# Patient Record
Sex: Male | Born: 1945 | Race: Black or African American | Hispanic: No | Smoking: Current every day smoker
Health system: Southern US, Community
[De-identification: ages and names within clinical notes are randomized; demographics above are authoritative.]

## PROBLEM LIST (undated history)

## (undated) DIAGNOSIS — I10 Essential (primary) hypertension: Secondary | ICD-10-CM

## (undated) DIAGNOSIS — J449 Chronic obstructive pulmonary disease, unspecified: Secondary | ICD-10-CM

## (undated) DIAGNOSIS — E785 Hyperlipidemia, unspecified: Secondary | ICD-10-CM

## (undated) DIAGNOSIS — E119 Type 2 diabetes mellitus without complications: Secondary | ICD-10-CM

## (undated) DIAGNOSIS — J45909 Unspecified asthma, uncomplicated: Secondary | ICD-10-CM

## (undated) DIAGNOSIS — I639 Cerebral infarction, unspecified: Secondary | ICD-10-CM

## (undated) DIAGNOSIS — N400 Enlarged prostate without lower urinary tract symptoms: Secondary | ICD-10-CM

## (undated) DIAGNOSIS — G629 Polyneuropathy, unspecified: Secondary | ICD-10-CM

## (undated) HISTORY — DX: Hyperlipidemia, unspecified: E78.5

## (undated) HISTORY — DX: Type 2 diabetes mellitus without complications: E11.9

## (undated) HISTORY — DX: Chronic obstructive pulmonary disease, unspecified: J44.9

## (undated) HISTORY — DX: Benign prostatic hyperplasia without lower urinary tract symptoms: N40.0

## (undated) HISTORY — DX: Essential (primary) hypertension: I10

## (undated) HISTORY — DX: Polyneuropathy, unspecified: G62.9

## (undated) HISTORY — DX: Cerebral infarction, unspecified: I63.9

## (undated) HISTORY — DX: Unspecified asthma, uncomplicated: J45.909

---

## 1991-11-27 HISTORY — PX: BUNIONECTOMY: SHX129

## 2015-12-02 LAB — HEMOGLOBIN A1C: HEMOGLOBIN A1C: 10

## 2016-04-09 LAB — HEMOGLOBIN A1C: HEMOGLOBIN A1C: 9.9

## 2016-05-28 ENCOUNTER — Encounter: Payer: Self-pay | Admitting: "Endocrinology

## 2016-05-28 ENCOUNTER — Ambulatory Visit (INDEPENDENT_AMBULATORY_CARE_PROVIDER_SITE_OTHER): Payer: Medicare HMO | Admitting: "Endocrinology

## 2016-05-28 VITALS — BP 168/101 | HR 90 | Ht 67.5 in | Wt 320.0 lb

## 2016-05-28 DIAGNOSIS — Z794 Long term (current) use of insulin: Secondary | ICD-10-CM

## 2016-05-28 DIAGNOSIS — E118 Type 2 diabetes mellitus with unspecified complications: Secondary | ICD-10-CM

## 2016-05-28 DIAGNOSIS — I10 Essential (primary) hypertension: Secondary | ICD-10-CM

## 2016-05-28 DIAGNOSIS — IMO0002 Reserved for concepts with insufficient information to code with codable children: Secondary | ICD-10-CM | POA: Insufficient documentation

## 2016-05-28 DIAGNOSIS — E1165 Type 2 diabetes mellitus with hyperglycemia: Secondary | ICD-10-CM | POA: Diagnosis not present

## 2016-05-28 NOTE — Patient Instructions (Signed)

## 2016-05-28 NOTE — Progress Notes (Signed)
Subjective:    Patient ID: Jeff Garcia, male    DOB: Jun 05, 1946. Patient is being seen in consultation for management of diabetes requested by  Ignatius Specking, MD  Past Medical History  Diagnosis Date  . Neuropathy (HCC)   . Asthma   . Hypertension   . COPD (chronic obstructive pulmonary disease) (HCC)   . BPH (benign prostatic hyperplasia)   . Hyperlipidemia   . Stroke [facility])    No past surgical history on file. Social History   Social History  . Marital Status: Single    Spouse Name: N/A  . Number of Children: N/A  . Years of Education: N/A   Social History Main Topics  . Smoking status: Current Every Day Smoker  . Smokeless tobacco: None  . Alcohol Use: 0.0 oz/week    0 Standard drinks or equivalent per week  . Drug Use: Yes    Special: Cocaine  . Sexual Activity: Not Asked   Other Topics Concern  . None   Social History Narrative  . None   Outpatient Encounter Prescriptions as of 05/28/2016  Medication Sig  . ALPRAZolam (XANAX) 0.5 MG tablet Take 0.5 mg by mouth at bedtime as needed for anxiety.  Marland Kitchen lisinopril (PRINIVIL,ZESTRIL) 10 MG tablet Take 10 mg by mouth daily.  . potassium chloride (K-DUR) 10 MEQ tablet Take 10 mEq by mouth daily.  . tamsulosin (FLOMAX) 0.4 MG CAPS capsule Take 0.4 mg by mouth.  . [DISCONTINUED] insulin aspart (NOVOLOG FLEXPEN) 100 UNIT/ML FlexPen Inject 20 Units into the skin 3 (three) times daily with meals.  . [DISCONTINUED] Insulin Glargine (LANTUS SOLOSTAR) 100 UNIT/ML Solostar Pen Inject 65 Units into the skin 2 (two) times daily.   No facility-administered encounter medications on file as of 05/28/2016.   ALLERGIES: No Known Allergies VACCINATION STATUS:  There is no immunization history on file for this patient.  Diabetes He presents for his initial diabetic visit. He has type 2 diabetes mellitus. Onset time: He was diagnosed at approximate age of 48 years. His disease course has been worsening. There are no hypoglycemic  associated symptoms. Pertinent negatives for hypoglycemia include no confusion, headaches, pallor or seizures. Associated symptoms include blurred vision, polydipsia and polyuria. Pertinent negatives for diabetes include no chest pain, no fatigue, no polyphagia and no weakness. There are no hypoglycemic complications. Symptoms are worsening. Diabetic complications include a CVA. Risk factors for coronary artery disease include diabetes mellitus, dyslipidemia, family history, hypertension, male sex, obesity, sedentary lifestyle and tobacco exposure. Current diabetic treatment includes insulin injections (He mentions he uses regular insulin 15 units with meals. Per his records show he is supposed to be on Lantus and NovoLog. He mentions that he does not afford his medications and has not taken any of these medications.). He is compliant with treatment some of the time. His weight is increasing steadily. He is following a generally unhealthy diet. He has not had a previous visit with a dietitian. He never participates in exercise. Home blood sugar record trend: He did not bring any meter nor logs to review today. He admits that he does not monitor blood glucose on regular basis. An ACE inhibitor/angiotensin II receptor blocker is being taken.  Hypertension This is a chronic problem. The current episode started more than 1 year ago. The problem is uncontrolled. Associated symptoms include blurred vision. Pertinent negatives include no chest pain, headaches, neck pain, palpitations or shortness of breath. Risk factors for coronary artery disease include dyslipidemia, diabetes mellitus, obesity,  male gender, family history, smoking/tobacco exposure and sedentary lifestyle. Past treatments include ACE inhibitors. Hypertensive end-organ damage includes CVA.   Review of Systems  Constitutional: Negative for fever, chills, fatigue and unexpected weight change.  HENT: Negative for dental problem, mouth sores and trouble  swallowing.   Eyes: Positive for blurred vision. Negative for visual disturbance.  Respiratory: Negative for cough, choking, chest tightness, shortness of breath and wheezing.   Cardiovascular: Negative for chest pain, palpitations and leg swelling.  Gastrointestinal: Negative for nausea, vomiting, abdominal pain, diarrhea, constipation and abdominal distention.  Endocrine: Positive for polydipsia and polyuria. Negative for polyphagia.  Genitourinary: Negative for dysuria, urgency, hematuria and flank pain.  Musculoskeletal: Negative for myalgias, back pain, gait problem and neck pain.  Skin: Negative for pallor, rash and wound.  Neurological: Negative for seizures, syncope, weakness, numbness and headaches.  Psychiatric/Behavioral: Negative.  Negative for confusion and dysphoric mood.    Objective:    BP 168/101 mmHg  Pulse 90  Ht 5' 7.5" (1.715 m)  Wt 320 lb (145.151 kg)  BMI 49.35 kg/m2  Wt Readings from Last 3 Encounters:  05/28/16 320 lb (145.151 kg)    Physical Exam  Constitutional: He is oriented to person, place, and time. He appears well-developed and well-nourished. He is cooperative. No distress.  HENT:  Head: Normocephalic and atraumatic.  Eyes: EOM are normal.  Neck: Normal range of motion. Neck supple. No tracheal deviation present. No thyromegaly present.  Cardiovascular: Normal rate, S1 normal, S2 normal and normal heart sounds.  Exam reveals no gallop.   No murmur heard. Pulses:      Dorsalis pedis pulses are 1+ on the right side, and 1+ on the left side.       Posterior tibial pulses are 1+ on the right side, and 1+ on the left side.  Pulmonary/Chest: Breath sounds normal. No respiratory distress. He has no wheezes.  Abdominal: Soft. Bowel sounds are normal. He exhibits no distension. There is no tenderness. There is no guarding and no CVA tenderness.  Musculoskeletal: He exhibits no edema.       Right shoulder: He exhibits no swelling and no deformity.   Neurological: He is alert and oriented to person, place, and time. He has normal strength and normal reflexes. No cranial nerve deficit or sensory deficit. Gait normal.  Skin: Skin is warm and dry. No rash noted. No cyanosis. Nails show no clubbing.  Psychiatric: He has a normal mood and affect. His speech is normal and behavior is normal. Judgment and thought content normal. Cognition and memory are normal.   Recent Results (from the past 2160 hour(s))  Hemoglobin A1c     Status: None   Collection Time: 04/09/16 12:00 AM  Result Value Ref Range   Hemoglobin A1C 9.9      Assessment & Plan:   1. Uncontrolled type 2 diabetes mellitus with complication, with long-term current use of insulin (HCC)   - Patient has currently uncontrolled symptomatic type 2 DM since  70 years of age,  with most recent A1c of 9.9 %.  -He does not have recent labs to review his renal function.   His diabetes is complicated by CVA, noncompliance, obesity, unaffordable medications and patient remains at a high risk for more acute and chronic complications of diabetes which include CAD, CVA, CKD, retinopathy, and neuropathy. These are all discussed in detail with the patient.  - I have counseled the patient on diet management and weight loss, by adopting a carbohydrate restricted/protein  rich diet.  - Suggestion is made for patient to avoid simple carbohydrates   from their diet including Cakes , Desserts, Ice Cream,  Soda (  diet and regular) , Sweet Tea , Candies,  Chips, Cookies, Artificial Sweeteners,   and "Sugar-free" Products . This will help patient to have stable blood glucose profile and potentially avoid unintended weight gain.  - I encouraged the patient to switch to  unprocessed or minimally processed complex starch and increased protein intake (animal or plant source), fruits, and vegetables.  - Patient is advised to stick to a routine mealtimes to eat 3 meals  a day and avoid unnecessary snacks ( to  snack only to correct hypoglycemia).  - The patient will be scheduled with Norm SaltPenny Crumpton, RDN, CDE for individualized DM education.  - I have approached patient with the following individualized plan to manage diabetes and patient agrees:   -  He may definitely require insulin therapy. However, it is not clear what kind of insulin he is taking and made it clear that he is not taking what is documented in his records namely Lantus and NovoLog.  -  I advised him to not take insulin until he returns in a week with his meter and logs.  -I  will proceed to initiate strict monitoring of glucose  AC and HS. -Patient is encouraged to call clinic for blood glucose levels less than 70 or above 300 mg /dl. - He mentions intolerance to metformin and other unidentified medications. However if his renal function is favorable, he will be considered for some oral medications. - He is concerned about cost of medications, I would consider premixed Novolin 70/30 if he requires insulin therapy. - Patient specific target  A1c;  LDL, HDL, Triglycerides, and  Waist Circumference were discussed in detail.  2) BP/HTN: Uncontrolled. Continue current medications including ACEI/ARB. 3) Lipids/HPL: Lipid panel unknown, he is not on statins. I will obtain fasting lipid panel on his subsequent visits. 4)  Weight/Diet: CDE Consult will be initiated , exercise, and detailed carbohydrates information provided.  5) Chronic Care/Health Maintenance:  -Patient is on ACEI/ARB  medications and encouraged to continue to follow up with Ophthalmology, Podiatrist at least yearly or according to recommendations, and advised to  quit stay away from smoking. I have recommended yearly flu vaccine and pneumonia vaccination at least every 5 years; moderate intensity exercise for up to 150 minutes weekly; and  sleep for at least 7 hours a day.  - 60 minutes of time was spent on the care of this patient , 50% of which was applied for counseling  on diabetes complications and their preventions.  - Patient to bring meter and  blood glucose logs during their next visit.   - I advised patient to maintain close follow up with Ignatius Speckinghruv B Vyas, MD for primary care needs.  Follow up plan: - Return in about 1 week (around 06/04/2016) for follow up with meter and logs- no labs.  Marquis LunchGebre Nida, MD Phone: [phone]  Fax: [phone]   05/28/2016, 12:39 PM

## 2016-05-31 ENCOUNTER — Telehealth: Payer: Self-pay

## 2016-05-31 NOTE — Telephone Encounter (Signed)
Pt left message on our voice mail stating that he has had high readings. Attempted to call him back. No answer.

## 2016-06-01 ENCOUNTER — Telehealth: Payer: Self-pay

## 2016-06-01 ENCOUNTER — Telehealth: Payer: Self-pay | Admitting: "Endocrinology

## 2016-06-01 NOTE — Telephone Encounter (Signed)
Pt states he has had high BG readings.   Date Before breakfast Before lunch Before supper Bedtime  7/4 296 HI  Took Novlin 60 units 187 244  7/5 434 took Novolin 60 units 196 346 after eating 355  7/6 149 331 437  Took Novolin 60 units 395 Took Novolin 60 units  7/7 570 took Novolin 60 units  79      Pt taking: Pt [facility] and has been taking 60 units on his own when he feels his BG is too high.

## 2016-06-01 NOTE — Telephone Encounter (Signed)
Pt called to discuss some things, have called 3 times but cannot leave message because his mail box is full

## 2016-06-04 ENCOUNTER — Encounter: Payer: Self-pay | Admitting: "Endocrinology

## 2016-06-04 ENCOUNTER — Ambulatory Visit (INDEPENDENT_AMBULATORY_CARE_PROVIDER_SITE_OTHER): Payer: Medicare HMO | Admitting: "Endocrinology

## 2016-06-04 VITALS — BP 146/66 | HR 108 | Ht 67.5 in | Wt 314.0 lb

## 2016-06-04 DIAGNOSIS — E118 Type 2 diabetes mellitus with unspecified complications: Secondary | ICD-10-CM | POA: Diagnosis not present

## 2016-06-04 DIAGNOSIS — E1165 Type 2 diabetes mellitus with hyperglycemia: Secondary | ICD-10-CM | POA: Diagnosis not present

## 2016-06-04 DIAGNOSIS — I10 Essential (primary) hypertension: Secondary | ICD-10-CM

## 2016-06-04 DIAGNOSIS — IMO0002 Reserved for concepts with insufficient information to code with codable children: Secondary | ICD-10-CM

## 2016-06-04 DIAGNOSIS — Z794 Long term (current) use of insulin: Secondary | ICD-10-CM

## 2016-06-04 MED ORDER — "INSULIN SYRINGE 29G X 1/2"" 0.5 ML MISC": Status: DC

## 2016-06-04 MED ORDER — INSULIN NPH ISOPHANE & REGULAR (70-30) 100 UNIT/ML ~~LOC~~ SUSP: 60.0000 [IU] | Freq: Two times a day (BID) | SUBCUTANEOUS | Status: DC

## 2016-06-04 NOTE — Telephone Encounter (Signed)
Advise pt to come today with every insulin he has at home. His meter, and his logs.

## 2016-06-04 NOTE — Telephone Encounter (Signed)
Spoke with pt on 06-01-16 and note sent to Dr Fransico HimNida

## 2016-06-04 NOTE — Telephone Encounter (Signed)
Pt coming in this afternoon with medications, logs, meter

## 2016-06-04 NOTE — Progress Notes (Signed)
Subjective:    Patient ID: Jeff Garcia, male    DOB: 08/31/1946. Patient is being seen in f/u for management of diabetes requested by  Ignatius Specking, MD  Past Medical History  Diagnosis Date  . Neuropathy (HCC)   . Asthma   . Hypertension   . COPD (chronic obstructive pulmonary disease) (HCC)   . BPH (benign prostatic hyperplasia)   . Hyperlipidemia   . Stroke [facility])    History reviewed. No pertinent past surgical history. Social History   Social History  . Marital Status: Single    Spouse Name: N/A  . Number of Children: N/A  . Years of Education: N/A   Social History Main Topics  . Smoking status: Current Every Day Smoker  . Smokeless tobacco: None  . Alcohol Use: 0.0 oz/week    0 Standard drinks or equivalent per week  . Drug Use: Yes    Special: Cocaine  . Sexual Activity: Not Asked   Other Topics Concern  . None   Social History Narrative   Outpatient Encounter Prescriptions as of 06/04/2016  Medication Sig  . aspirin 81 MG tablet Take 81 mg by mouth daily.  . Misc Natural Products (PROSTATE HEALTH PO) Take by mouth.  . vitamin E 1000 UNIT capsule Take 1,000 Units by mouth daily.  . [DISCONTINUED] insulin regular (NOVOLIN R,HUMULIN R) 100 units/mL injection Inject into the skin.  Marland Kitchen ALPRAZolam (XANAX) 0.5 MG tablet Take 0.5 mg by mouth at bedtime as needed for anxiety.  . insulin NPH-regular Human (NOVOLIN 70/30 RELION) (70-30) 100 UNIT/ML injection Inject 60 Units into the skin 2 (two) times daily with a meal. Inject 60 units with breakfast and 60 units with supper only if blood glucose is above 90 and you are about to eat.  . INSULIN SYRINGE .5CC/29G 29G X 1/2" 0.5 ML MISC Use to inject insulin 2 x a day  . lisinopril (PRINIVIL,ZESTRIL) 10 MG tablet Take 10 mg by mouth daily.  . potassium chloride (K-DUR) 10 MEQ tablet Take 10 mEq by mouth daily.  . tamsulosin (FLOMAX) 0.4 MG CAPS capsule Take 0.4 mg by mouth.  . [DISCONTINUED] insulin NPH-regular Human  (NOVOLIN 70/30 RELION) (70-30) 100 UNIT/ML injection Inject 60 Units into the skin 2 (two) times daily with a meal. Inject 60 units with breakfast and 60 units with supper only if blood glucose is above 90 and you are about to eat.   No facility-administered encounter medications on file as of 06/04/2016.   ALLERGIES: No Known Allergies VACCINATION STATUS:  There is no immunization history on file for this patient.  Diabetes He presents for his follow-up diabetic visit. He has type 2 diabetes mellitus. Onset time: He was diagnosed at approximate age of 48 years. His disease course has been worsening. There are no hypoglycemic associated symptoms. Pertinent negatives for hypoglycemia include no confusion, headaches, pallor or seizures. Associated symptoms include blurred vision, polydipsia and polyuria. Pertinent negatives for diabetes include no chest pain, no fatigue, no polyphagia and no weakness. There are no hypoglycemic complications. Symptoms are worsening. Diabetic complications include a CVA. Risk factors for coronary artery disease include diabetes mellitus, dyslipidemia, family history, hypertension, male sex, obesity, sedentary lifestyle and tobacco exposure. Current diabetic treatment includes insulin injections (He brings with him regular insulin box and claims to have been using 60 units 4 times a day.). He is compliant with treatment some of the time. His weight is decreasing steadily. He is following a generally unhealthy diet. He  has not had a previous visit with a dietitian. He never participates in exercise. His breakfast blood glucose range is generally >200 mg/dl. His lunch blood glucose range is generally >200 mg/dl. His dinner blood glucose range is generally >200 mg/dl. His overall blood glucose range is >200 mg/dl. An ACE inhibitor/angiotensin II receptor blocker is being taken.  Hypertension This is a chronic problem. The current episode started more than 1 year ago. The problem  is uncontrolled. Associated symptoms include blurred vision. Pertinent negatives include no chest pain, headaches, neck pain, palpitations or shortness of breath. Risk factors for coronary artery disease include dyslipidemia, diabetes mellitus, obesity, male gender, family history, smoking/tobacco exposure and sedentary lifestyle. Past treatments include ACE inhibitors. Hypertensive end-organ damage includes CVA.   Review of Systems  Constitutional: Negative for fever, chills, fatigue and unexpected weight change.  HENT: Negative for dental problem, mouth sores and trouble swallowing.   Eyes: Positive for blurred vision. Negative for visual disturbance.  Respiratory: Negative for cough, choking, chest tightness, shortness of breath and wheezing.   Cardiovascular: Negative for chest pain, palpitations and leg swelling.  Gastrointestinal: Negative for nausea, vomiting, abdominal pain, diarrhea, constipation and abdominal distention.  Endocrine: Positive for polydipsia and polyuria. Negative for polyphagia.  Genitourinary: Negative for dysuria, urgency, hematuria and flank pain.  Musculoskeletal: Negative for myalgias, back pain, gait problem and neck pain.  Skin: Negative for pallor, rash and wound.  Neurological: Negative for seizures, syncope, weakness, numbness and headaches.  Psychiatric/Behavioral: Negative.  Negative for confusion and dysphoric mood.    Objective:    BP 146/66 mmHg  Pulse 108  Ht 5' 7.5" (1.715 m)  Wt 314 lb (142.429 kg)  BMI 48.43 kg/m2  Wt Readings from Last 3 Encounters:  06/04/16 314 lb (142.429 kg)  05/28/16 320 lb (145.151 kg)    Physical Exam  Constitutional: He is oriented to person, place, and time. He appears well-developed and well-nourished. He is cooperative. No distress.  HENT:  Head: Normocephalic and atraumatic.  Eyes: EOM are normal.  Neck: Normal range of motion. Neck supple. No tracheal deviation present. No thyromegaly present.   Cardiovascular: Normal rate, S1 normal, S2 normal and normal heart sounds.  Exam reveals no gallop.   No murmur heard. Pulses:      Dorsalis pedis pulses are 1+ on the right side, and 1+ on the left side.       Posterior tibial pulses are 1+ on the right side, and 1+ on the left side.  Pulmonary/Chest: Breath sounds normal. No respiratory distress. He has no wheezes.  Abdominal: Soft. Bowel sounds are normal. He exhibits no distension. There is no tenderness. There is no guarding and no CVA tenderness.  Musculoskeletal: He exhibits no edema.       Right shoulder: He exhibits no swelling and no deformity.  Neurological: He is alert and oriented to person, place, and time. He has normal strength and normal reflexes. No cranial nerve deficit or sensory deficit. Gait normal.  Skin: Skin is warm and dry. No rash noted. No cyanosis. Nails show no clubbing.  Psychiatric: He has a normal mood and affect. His speech is normal and behavior is normal. Judgment and thought content normal. Cognition and memory are normal.   Recent Results (from the past 2160 hour(s))  Hemoglobin A1c     Status: None   Collection Time: 04/09/16 12:00 AM  Result Value Ref Range   Hemoglobin A1C 9.9      Assessment & Plan:  1. Uncontrolled type 2 diabetes mellitus with complication, with long-term current use of insulin (HCC)   - Patient has currently uncontrolled symptomatic type 2 DM since  70 years of age,  with most recent A1c of 9.9 %.  -He does not have recent labs to review his renal function.   His diabetes is complicated by CVA, noncompliance, obesity, unaffordable medications and patient remains at a high risk for more acute and chronic complications of diabetes which include CAD, CVA, CKD, retinopathy, and neuropathy. These are all discussed in detail with the patient.  - I have counseled the patient on diet management and weight loss, by adopting a carbohydrate restricted/protein rich diet.  -  Suggestion is made for patient to avoid simple carbohydrates   from their diet including Cakes , Desserts, Ice Cream,  Soda (  diet and regular) , Sweet Tea , Candies,  Chips, Cookies, Artificial Sweeteners,   and "Sugar-free" Products . This will help patient to have stable blood glucose profile and potentially avoid unintended weight gain.  - I encouraged the patient to switch to  unprocessed or minimally processed complex starch and increased protein intake (animal or plant source), fruits, and vegetables.  - Patient is advised to stick to a routine mealtimes to eat 3 meals  a day and avoid unnecessary snacks ( to snack only to correct hypoglycemia).  - The patient will be scheduled with Norm Salt, RDN, CDE for individualized DM education.  - I have approached patient with the following individualized plan to manage diabetes and patient agrees:   - He claims that he cannot afford insulin analogs. Regular insulin is not the best choice for him. I  I have approached him with the choice of premixed insulin  Novolin 70/30 to use 60 units twice a day with breakfast and supper for pre-meal blood glucose above 90 mg/dL.   - He is at advised to continue strict monitoring of blood glucose before meals and at bedtime and present his meter and logs in 2 weeks for reevaluation.    -Patient is encouraged to call clinic for blood glucose levels less than 70 or above 300 mg /dl. - He mentions intolerance to metformin and other unidentified medications. However if his renal function is favorable, he will be considered for some oral medications.  - Patient specific target  A1c;  LDL, HDL, Triglycerides, and  Waist Circumference were discussed in detail.  2) BP/HTN: Uncontrolled. Continue current medications including ACEI/ARB. 3) Lipids/HPL: Lipid panel unknown, he is not on statins. I will obtain fasting lipid panel on his subsequent visits. 4)  Weight/Diet: CDE Consult will be initiated , exercise, and  detailed carbohydrates information provided.  5) Chronic Care/Health Maintenance:  -Patient is on ACEI/ARB  medications and encouraged to continue to follow up with Ophthalmology, Podiatrist at least yearly or according to recommendations, and advised to  quit stay away from smoking. I have recommended yearly flu vaccine and pneumonia vaccination at least every 5 years; moderate intensity exercise for up to 150 minutes weekly; and  sleep for at least 7 hours a day.  -25 minutes of time was spent on the care of this patient , 50% of which was applied for counseling on diabetes complications and their preventions.  - Patient to bring meter and  blood glucose logs during their next visit.   - I advised patient to maintain close follow up with Ignatius Specking, MD for primary care needs.  Follow up plan: - Return in  about 2 weeks (around 06/18/2016) for follow up with meter and logs- no labs.  Marquis Lunch, MD Phone: [phone]  Fax: [phone]   06/04/2016, 4:08 PM

## 2016-06-04 NOTE — Patient Instructions (Signed)

## 2016-06-06 ENCOUNTER — Encounter: Payer: Self-pay | Admitting: "Endocrinology

## 2016-06-08 ENCOUNTER — Ambulatory Visit: Payer: Medicare HMO | Admitting: "Endocrinology

## 2016-06-18 ENCOUNTER — Encounter: Payer: Self-pay | Admitting: "Endocrinology

## 2016-06-18 ENCOUNTER — Encounter: Payer: Medicare HMO | Attending: "Endocrinology | Admitting: Nutrition

## 2016-06-18 ENCOUNTER — Encounter: Payer: Self-pay | Admitting: Nutrition

## 2016-06-18 ENCOUNTER — Ambulatory Visit (INDEPENDENT_AMBULATORY_CARE_PROVIDER_SITE_OTHER): Payer: Medicare HMO | Admitting: "Endocrinology

## 2016-06-18 VITALS — BP 132/78 | HR 94 | Resp 18 | Ht 67.5 in | Wt 309.0 lb

## 2016-06-18 VITALS — Ht 68.0 in | Wt 309.0 lb

## 2016-06-18 DIAGNOSIS — IMO0002 Reserved for concepts with insufficient information to code with codable children: Secondary | ICD-10-CM

## 2016-06-18 DIAGNOSIS — E1165 Type 2 diabetes mellitus with hyperglycemia: Secondary | ICD-10-CM

## 2016-06-18 DIAGNOSIS — Z794 Long term (current) use of insulin: Secondary | ICD-10-CM

## 2016-06-18 DIAGNOSIS — Z713 Dietary counseling and surveillance: Secondary | ICD-10-CM | POA: Insufficient documentation

## 2016-06-18 DIAGNOSIS — E118 Type 2 diabetes mellitus with unspecified complications: Secondary | ICD-10-CM | POA: Diagnosis not present

## 2016-06-18 DIAGNOSIS — I10 Essential (primary) hypertension: Secondary | ICD-10-CM

## 2016-06-18 NOTE — Progress Notes (Signed)
Subjective:    Patient ID: Jeff Garcia, male    DOB: 01-30-1946. Patient is being seen in f/u for management of diabetes requested by  Ignatius Specking, MD  Past Medical History:  Diagnosis Date  . Asthma   . BPH (benign prostatic hyperplasia)   . COPD (chronic obstructive pulmonary disease) (HCC)   . Hyperlipidemia   . Hypertension   . Neuropathy (HCC)   . Stroke [facility])    No past surgical history on file. Social History   Social History  . Marital status: Single    Spouse name: N/A  . Number of children: N/A  . Years of education: N/A   Social History Main Topics  . Smoking status: Current Every Day Smoker  . Smokeless tobacco: None  . Alcohol use 0.0 oz/week  . Drug use:     Types: Cocaine  . Sexual activity: Not Asked   Other Topics Concern  . None   Social History Narrative  . None   Outpatient Encounter Prescriptions as of 06/18/2016  Medication Sig  . ALPRAZolam (XANAX) 0.5 MG tablet Take 0.5 mg by mouth at bedtime as needed for anxiety.  Marland Kitchen aspirin 81 MG tablet Take 81 mg by mouth daily.  . insulin NPH-regular Human (NOVOLIN 70/30 RELION) (70-30) 100 UNIT/ML injection Inject 60 Units into the skin 2 (two) times daily with a meal. Inject 60 units with breakfast and 60 units with supper only if blood glucose is above 90 and you are about to eat.  . INSULIN SYRINGE .5CC/29G 29G X 1/2" 0.5 ML MISC Use to inject insulin 2 x a day  . lisinopril (PRINIVIL,ZESTRIL) 10 MG tablet Take 10 mg by mouth daily.  . Misc Natural Products (PROSTATE HEALTH PO) Take by mouth.  . potassium chloride (K-DUR) 10 MEQ tablet Take 10 mEq by mouth daily.  . tamsulosin (FLOMAX) 0.4 MG CAPS capsule Take 0.4 mg by mouth.  . vitamin E 1000 UNIT capsule Take 1,000 Units by mouth daily.   No facility-administered encounter medications on file as of 06/18/2016.    ALLERGIES: No Known Allergies VACCINATION STATUS:  There is no immunization history on file for this patient.  Diabetes  He  presents for his follow-up diabetic visit. He has type 2 diabetes mellitus. Onset time: He was diagnosed at approximate age of 48 years. His disease course has been improving. There are no hypoglycemic associated symptoms. Pertinent negatives for hypoglycemia include no confusion, headaches, pallor or seizures. Associated symptoms include blurred vision, polydipsia and polyuria. Pertinent negatives for diabetes include no chest pain, no fatigue, no polyphagia and no weakness. There are no hypoglycemic complications. Symptoms are improving. Diabetic complications include a CVA. Risk factors for coronary artery disease include diabetes mellitus, dyslipidemia, family history, hypertension, male sex, obesity, sedentary lifestyle and tobacco exposure. Current diabetic treatment includes insulin injections (He brings with him regular insulin box and claims to have been using 60 units 4 times a day.). He is compliant with treatment some of the time. His weight is decreasing steadily. He is following a generally unhealthy diet. He has not had a previous visit with a dietitian. He never participates in exercise. His breakfast blood glucose range is generally 180-200 mg/dl. His dinner blood glucose range is generally 180-200 mg/dl. His overall blood glucose range is 180-200 mg/dl. An ACE inhibitor/angiotensin II receptor blocker is being taken.  Hypertension  This is a chronic problem. The current episode started more than 1 year ago. The problem is uncontrolled.  Associated symptoms include blurred vision. Pertinent negatives include no chest pain, headaches, neck pain, palpitations or shortness of breath. Risk factors for coronary artery disease include dyslipidemia, diabetes mellitus, obesity, male gender, family history, smoking/tobacco exposure and sedentary lifestyle. Past treatments include ACE inhibitors. Hypertensive end-organ damage includes CVA.   Review of Systems  Constitutional: Negative for chills, fatigue,  fever and unexpected weight change.  HENT: Negative for dental problem, mouth sores and trouble swallowing.   Eyes: Positive for blurred vision. Negative for visual disturbance.  Respiratory: Negative for cough, choking, chest tightness, shortness of breath and wheezing.   Cardiovascular: Negative for chest pain, palpitations and leg swelling.  Gastrointestinal: Negative for abdominal distention, abdominal pain, constipation, diarrhea, nausea and vomiting.  Endocrine: Positive for polydipsia and polyuria. Negative for polyphagia.  Genitourinary: Negative for dysuria, flank pain, hematuria and urgency.  Musculoskeletal: Negative for back pain, gait problem, myalgias and neck pain.  Skin: Negative for pallor, rash and wound.  Neurological: Negative for seizures, syncope, weakness, numbness and headaches.  Psychiatric/Behavioral: Negative.  Negative for confusion and dysphoric mood.    Objective:    BP 132/78 (BP Location: Right Leg, Patient Position: Sitting, Cuff Size: Large)   Pulse 94   Resp 18   Ht 5' 7.5" (1.715 m)   Wt (!) 309 lb (140.2 kg)   SpO2 97%   BMI 47.68 kg/m   Wt Readings from Last 3 Encounters:  06/18/16 (!) 309 lb (140.2 kg)  06/18/16 (!) 309 lb (140.2 kg)  06/04/16 (!) 314 lb (142.4 kg)    Physical Exam  Constitutional: He is oriented to person, place, and time. He appears well-developed and well-nourished. He is cooperative. No distress.  HENT:  Head: Normocephalic and atraumatic.  Eyes: EOM are normal.  Neck: Normal range of motion. Neck supple. No tracheal deviation present. No thyromegaly present.  Cardiovascular: Normal rate, S1 normal, S2 normal and normal heart sounds.  Exam reveals no gallop.   No murmur heard. Pulses:      Dorsalis pedis pulses are 1+ on the right side, and 1+ on the left side.       Posterior tibial pulses are 1+ on the right side, and 1+ on the left side.  Pulmonary/Chest: Breath sounds normal. No respiratory distress. He has no  wheezes.  Abdominal: Soft. Bowel sounds are normal. He exhibits no distension. There is no tenderness. There is no guarding and no CVA tenderness.  Musculoskeletal: He exhibits no edema.       Right shoulder: He exhibits no swelling and no deformity.  Neurological: He is alert and oriented to person, place, and time. He has normal strength and normal reflexes. No cranial nerve deficit or sensory deficit. Gait normal.  Skin: Skin is warm and dry. No rash noted. No cyanosis. Nails show no clubbing.  Psychiatric: He has a normal mood and affect. His speech is normal and behavior is normal. Judgment and thought content normal. Cognition and memory are normal.   Recent Results (from the past 2160 hour(s))  Hemoglobin A1c     Status: None   Collection Time: 04/09/16 12:00 AM  Result Value Ref Range   Hemoglobin A1C 9.9      Assessment & Plan:   1. Uncontrolled type 2 diabetes mellitus with complication, with long-term current use of insulin (HCC)   - Patient has currently uncontrolled symptomatic type 2 DM since  70 years of age,  with most recent A1c of 9.9 %.  -He does not have recent  labs to review his renal function.   His diabetes is complicated by CVA, noncompliance, obesity, unaffordable medications and patient remains at a high risk for more acute and chronic complications of diabetes which include CAD, CVA, CKD, retinopathy, and neuropathy. These are all discussed in detail with the patient.  - I have counseled the patient on diet management and weight loss, by adopting a carbohydrate restricted/protein rich diet.  - Suggestion is made for patient to avoid simple carbohydrates   from their diet including Cakes , Desserts, Ice Cream,  Soda (  diet and regular) , Sweet Tea , Candies,  Chips, Cookies, Artificial Sweeteners,   and "Sugar-free" Products . This will help patient to have stable blood glucose profile and potentially avoid unintended weight gain.  - I encouraged the patient  to switch to  unprocessed or minimally processed complex starch and increased protein intake (animal or plant source), fruits, and vegetables.  - Patient is advised to stick to a routine mealtimes to eat 3 meals  a day and avoid unnecessary snacks ( to snack only to correct hypoglycemia).  - The patient will be scheduled with Norm Salt, RDN, CDE for individualized DM education.  - I have approached patient with the following individualized plan to manage diabetes and patient agrees:   - He claims that he cannot afford insulin analogs. - His blood glucose profile is reasonably improving from last visit.  -I  I have approached him to continue with  premixed insulin  Novolin 70/30  60 units twice a day with breakfast and supper for pre-meal blood glucose above 90 mg/dL.   - He is at advised to continue strict monitoring of blood glucose before meals and at bedtime and present his meter and logs in 2 weeks for reevaluation.    -Patient is encouraged to call clinic for blood glucose levels less than 70 or above 300 mg /dl. - He mentions intolerance to metformin and other unidentified medications. However if his renal function is favorable, he will be considered for some oral medications.  - Patient specific target  A1c;  LDL, HDL, Triglycerides, and  Waist Circumference were discussed in detail.  2) BP/HTN: Uncontrolled. Continue current medications including ACEI/ARB. 3) Lipids/HPL: Lipid panel unknown, he is not on statins. I will obtain fasting lipid panel on his subsequent visits. 4)  Weight/Diet: CDE Consult will be initiated , exercise, and detailed carbohydrates information provided.  5) Chronic Care/Health Maintenance:  -Patient is on ACEI/ARB  medications and encouraged to continue to follow up with Ophthalmology, Podiatrist at least yearly or according to recommendations, and advised to  quit stay away from smoking. I have recommended yearly flu vaccine and pneumonia vaccination at  least every 5 years; moderate intensity exercise for up to 150 minutes weekly; and  sleep for at least 7 hours a day.  -25 minutes of time was spent on the care of this patient , 50% of which was applied for counseling on diabetes complications and their preventions.  - Patient to bring meter and  blood glucose logs during their next visit.   - I advised patient to maintain close follow up with Ignatius Specking, MD for primary care needs.  Follow up plan: - Return in about 4 weeks (around 07/16/2016) for follow up with pre-visit labs, meter, and logs.  Marquis Lunch, MD Phone: [phone]  Fax: [phone]   06/18/2016, 1:22 PM

## 2016-06-18 NOTE — Patient Instructions (Addendum)
Goals Follow My Plate Eat 3-4 carb choices per meal. Cut out Crystal Light and drink water instead. Increase fresh fruits and vegetables. Follow Low sodium low fat diet as discussed Cut out processed meats. Lose 1-2 lb per week. Get A1C to 7%.  Choose Healthy Choice or Smart Ones for TV dinners as needed.

## 2016-06-18 NOTE — Progress Notes (Signed)
  Medical Nutrition Therapy:  Appt start time: 1130 end time:  1230.   Assessment:  Primary concerns today: Type 2. A1C 9.9% Sees Dr. Fransico Him and saw him today. Lost 20 lbs in the last month. He has cut out snacks between meals, cutting back on portions . Taking 60 units of 70/30 Novolin  Injects in arm usually. Advised to inject in abdomen for best results.. Meter downloaded.  7 day ave 268 mg/dl,  Eats three meals. Testing blood sugars 3-4 times per day. Exericising water aerobics three times per week. Diet is excessive in sodium, CHO and low in fresh fruits and vegetables.  Lab Results  Component Value Date   HGBA1C 9.9 04/09/2016    Preferred Learning Style:     No preference indicated   Learning Readiness:   Ready  Change in progress   MEDICATIONS: see list   DIETARY INTAKE:   24-hr recall:  B ( AM): Shreddred wheat with 2% milk,  Snk ( AM): none  L ( PM): Chicken, rice, black eyed peas,, juice Snk ( PM): none D ( PM): Breakfast energy bar, juice Snk ( PM):PB crackers.  Beverages: juice, water, crystal light  Usual physical activity: water aerobics  Estimated energy needs: 1800 calories 200 g carbohydrates 135 g protein 50 g fat  Progress Towards Goal(s):  In progress.   Nutritional Diagnosis:  NB-1.1 Food and nutrition-related knowledge deficit As related to Diabetes.  As evidenced by A1C.    Intervention:  Nutrition and Diabetes education provided on My Plate, CHO counting, meal planning, portion sizes, timing of meals, avoiding snacks between meals unless having a low blood sugar, target ranges for A1C and blood sugars, signs/symptoms and treatment of hyper/hypoglycemia, monitoring blood sugars, taking medications as prescribed, benefits of exercising 30 minutes per day and prevention of complications of DM.  Goals Follow My Plate Eat 3-4 carb choices per meal. Cut out Crystal Light and drink water instead. Increase fresh fruits and vegetables. Follow Low  sodium low fat diet as discussed Cut out processed meats. Lose 1-2 lb per week. Get A1C to 7%. Choose Healthy Choice or Smart Ones for TV dinners as needed.   Teaching Method Utilized: Visual Auditory Hands on  Handouts given during visit include:  The Plate Method   Meal Plan Card  Diabetes Instructions.   Barriers to learning/adherence to lifestyle change:  None  Demonstrated degree of understanding via:  Teach Back   Monitoring/Evaluation:  Dietary intake, exercise, meal planning, BG, and body weight in 1 month(s).

## 2016-07-26 ENCOUNTER — Encounter: Payer: Self-pay | Admitting: "Endocrinology

## 2016-07-26 ENCOUNTER — Encounter: Payer: Medicare HMO | Attending: "Endocrinology | Admitting: Nutrition

## 2016-07-26 ENCOUNTER — Ambulatory Visit (INDEPENDENT_AMBULATORY_CARE_PROVIDER_SITE_OTHER): Payer: Medicare HMO | Admitting: "Endocrinology

## 2016-07-26 ENCOUNTER — Encounter: Payer: Self-pay | Admitting: Nutrition

## 2016-07-26 VITALS — Ht 68.0 in | Wt 308.0 lb

## 2016-07-26 VITALS — BP 181/94 | HR 84 | Ht 67.5 in | Wt 308.0 lb

## 2016-07-26 DIAGNOSIS — Z794 Long term (current) use of insulin: Secondary | ICD-10-CM

## 2016-07-26 DIAGNOSIS — I10 Essential (primary) hypertension: Secondary | ICD-10-CM

## 2016-07-26 DIAGNOSIS — Z713 Dietary counseling and surveillance: Secondary | ICD-10-CM | POA: Insufficient documentation

## 2016-07-26 DIAGNOSIS — E1165 Type 2 diabetes mellitus with hyperglycemia: Secondary | ICD-10-CM

## 2016-07-26 DIAGNOSIS — E118 Type 2 diabetes mellitus with unspecified complications: Secondary | ICD-10-CM

## 2016-07-26 DIAGNOSIS — IMO0002 Reserved for concepts with insufficient information to code with codable children: Secondary | ICD-10-CM

## 2016-07-26 MED ORDER — INSULIN NPH ISOPHANE & REGULAR (70-30) 100 UNIT/ML ~~LOC~~ SUSP: 60.0000 [IU] | Freq: Two times a day (BID) | SUBCUTANEOUS | 2 refills | Status: DC
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
## 2016-07-26 NOTE — Progress Notes (Signed)
Subjective:    Patient ID: Jeff Garcia, male    DOB: November 02, 1946. Patient is being seen in f/u for management of diabetes requested by  Ignatius Specking, MD  Past Medical History:  Diagnosis Date  . Asthma   . BPH (benign prostatic hyperplasia)   . COPD (chronic obstructive pulmonary disease) (HCC)   . Hyperlipidemia   . Hypertension   . Neuropathy (HCC)   . Stroke [facility])    History reviewed. No pertinent surgical history. Social History   Social History  . Marital status: Single    Spouse name: N/A  . Number of children: N/A  . Years of education: N/A   Social History Main Topics  . Smoking status: Current Every Day Smoker  . Smokeless tobacco: Never Used  . Alcohol use 0.0 oz/week  . Drug use:     Types: Cocaine  . Sexual activity: Not Asked   Other Topics Concern  . None   Social History Narrative  . None   Outpatient Encounter Prescriptions as of 07/26/2016  Medication Sig  . Cholecalciferol (VITAMIN D) 2000 units CAPS Take by mouth.  . furosemide (LASIX) 40 MG tablet Take 40 mg by mouth daily.  . insulin NPH-regular Human (NOVOLIN 70/30 RELION) (70-30) 100 UNIT/ML injection Inject 60-70 Units into the skin 2 (two) times daily with a meal. Inject 70 units with breakfast and 60 units with supper only if blood glucose is above 90 and you are about to eat.  . INSULIN SYRINGE .5CC/29G 29G X 1/2" 0.5 ML MISC Use to inject insulin 2 x a day  . Misc Natural Products (PROSTATE HEALTH PO) Take by mouth.  . vitamin E 1000 UNIT capsule Take 1,000 Units by mouth daily.  . [DISCONTINUED] insulin NPH-regular Human (NOVOLIN 70/30 RELION) (70-30) 100 UNIT/ML injection Inject 60 Units into the skin 2 (two) times daily with a meal. Inject 60 units with breakfast and 60 units with supper only if blood glucose is above 90 and you are about to eat.  Marland Kitchen ALPRAZolam (XANAX) 0.5 MG tablet Take 0.5 mg by mouth at bedtime as needed for anxiety.  Marland Kitchen aspirin 81 MG tablet Take 81 mg by mouth  daily.  Marland Kitchen lisinopril (PRINIVIL,ZESTRIL) 10 MG tablet Take 10 mg by mouth daily.  . potassium chloride (K-DUR) 10 MEQ tablet Take 10 mEq by mouth daily.  . tamsulosin (FLOMAX) 0.4 MG CAPS capsule Take 0.4 mg by mouth.   No facility-administered encounter medications on file as of 07/26/2016.    ALLERGIES: No Known Allergies VACCINATION STATUS:  There is no immunization history on file for this patient.  Diabetes  He presents for his follow-up diabetic visit. He has type 2 diabetes mellitus. Onset time: He was diagnosed at approximate age of 48 years. His disease course has been improving. There are no hypoglycemic associated symptoms. Pertinent negatives for hypoglycemia include no confusion, headaches, pallor or seizures. Associated symptoms include blurred vision, polydipsia and polyuria. Pertinent negatives for diabetes include no chest pain, no fatigue, no polyphagia and no weakness. There are no hypoglycemic complications. Symptoms are improving. Diabetic complications include a CVA. Risk factors for coronary artery disease include diabetes mellitus, dyslipidemia, family history, hypertension, male sex, obesity, sedentary lifestyle and tobacco exposure. Current diabetic treatment includes insulin injections (He changed around the doses of NovoLog 70/30 taking up to 120 units at times despite my advice for him to take 60 units twice a day with breakfast and supper.). He is compliant with treatment some  of the time. His weight is stable. He is following a generally unhealthy diet. He has not had a previous visit with a dietitian. He never participates in exercise. His breakfast blood glucose range is generally 130-140 mg/dl. His lunch blood glucose range is generally >200 mg/dl. His dinner blood glucose range is generally >200 mg/dl. His overall blood glucose range is >200 mg/dl. An ACE inhibitor/angiotensin II receptor blocker is being taken.  Hypertension  This is a chronic problem. The current  episode started more than 1 year ago. The problem is uncontrolled. Associated symptoms include blurred vision. Pertinent negatives include no chest pain, headaches, neck pain, palpitations or shortness of breath. Risk factors for coronary artery disease include dyslipidemia, diabetes mellitus, obesity, male gender, family history, smoking/tobacco exposure and sedentary lifestyle. Past treatments include ACE inhibitors. Hypertensive end-organ damage includes CVA.   Review of Systems  Constitutional: Negative for chills, fatigue, fever and unexpected weight change.  HENT: Negative for dental problem, mouth sores and trouble swallowing.   Eyes: Positive for blurred vision. Negative for visual disturbance.  Respiratory: Negative for cough, choking, chest tightness, shortness of breath and wheezing.   Cardiovascular: Negative for chest pain, palpitations and leg swelling.  Gastrointestinal: Negative for abdominal distention, abdominal pain, constipation, diarrhea, nausea and vomiting.  Endocrine: Positive for polydipsia and polyuria. Negative for polyphagia.  Genitourinary: Negative for dysuria, flank pain, hematuria and urgency.  Musculoskeletal: Negative for back pain, gait problem, myalgias and neck pain.  Skin: Negative for pallor, rash and wound.  Neurological: Negative for seizures, syncope, weakness, numbness and headaches.  Psychiatric/Behavioral: Negative.  Negative for confusion and dysphoric mood.    Objective:    BP (!) 181/94   Pulse 84   Ht 5' 7.5" (1.715 m)   Wt (!) 308 lb (139.7 kg)   BMI 47.53 kg/m   Wt Readings from Last 3 Encounters:  07/26/16 (!) 308 lb (139.7 kg)  06/18/16 (!) 309 lb (140.2 kg)  06/18/16 (!) 309 lb (140.2 kg)    Physical Exam  Constitutional: He is oriented to person, place, and time. He appears well-developed and well-nourished. He is cooperative. No distress.  HENT:  Head: Normocephalic and atraumatic.  Eyes: EOM are normal.  Neck: Normal range of  motion. Neck supple. No tracheal deviation present. No thyromegaly present.  Cardiovascular: Normal rate, S1 normal, S2 normal and normal heart sounds.  Exam reveals no gallop.   No murmur heard. Pulses:      Dorsalis pedis pulses are 1+ on the right side, and 1+ on the left side.       Posterior tibial pulses are 1+ on the right side, and 1+ on the left side.  Pulmonary/Chest: Breath sounds normal. No respiratory distress. He has no wheezes.  Abdominal: Soft. Bowel sounds are normal. He exhibits no distension. There is no tenderness. There is no guarding and no CVA tenderness.  Musculoskeletal: He exhibits no edema.       Right shoulder: He exhibits no swelling and no deformity.  Neurological: He is alert and oriented to person, place, and time. He has normal strength and normal reflexes. No cranial nerve deficit or sensory deficit. Gait normal.  Skin: Skin is warm and dry. No rash noted. No cyanosis. Nails show no clubbing.  Psychiatric: He has a normal mood and affect. His speech is normal and behavior is normal. Judgment and thought content normal. Cognition and memory are normal.   He did not go for the ordered labs before this visit.  Assessment & Plan:   1. Uncontrolled type 2 diabetes mellitus with complication, with long-term current use of insulin (HCC)   - Patient has currently uncontrolled symptomatic type 2 DM since  70 years of age,  with most recent A1c of 9.9 %.  -He does not have recent labs to review his renal function. - He has lab orders in place to do sometime this week.  His diabetes is complicated by CVA, noncompliance, obesity, unaffordable medications and patient remains at a high risk for more acute and chronic complications of diabetes which include CAD, CVA, CKD, retinopathy, and neuropathy. These are all discussed in detail with the patient.  - I have counseled the patient on diet management and weight loss, by adopting a carbohydrate restricted/protein rich  diet.  - Suggestion is made for patient to avoid simple carbohydrates   from their diet including Cakes , Desserts, Ice Cream,  Soda (  diet and regular) , Sweet Tea , Candies,  Chips, Cookies, Artificial Sweeteners,   and "Sugar-free" Products . This will help patient to have stable blood glucose profile and potentially avoid unintended weight gain.  - I encouraged the patient to switch to  unprocessed or minimally processed complex starch and increased protein intake (animal or plant source), fruits, and vegetables.  - Patient is advised to stick to a routine mealtimes to eat 3 meals  a day and avoid unnecessary snacks ( to snack only to correct hypoglycemia).  - The patient will be scheduled with Norm Salt, RDN, CDE for individualized DM education.  - I have approached patient with the following individualized plan to manage diabetes and patient agrees:    - His blood glucose profile is reasonably improving from last visit.  -I  I have approached him to continue with  premixed insulin  Novolin 70/30  70 units with breakfast and 60 units with supper  for pre-meal blood glucose above 90 mg/dL.   - He is at advised to continue strict monitoring of blood glucose before meals and at bedtime and present his meter and logs in 2 weeks for reevaluation.    -Patient is encouraged to call clinic for blood glucose levels less than 70 or above 300 mg /dl. - He mentions intolerance to metformin and other unidentified medications. However if his renal function is favorable, he will be considered for some oral medications. Unfortunately he did not do his labs.   - Patient specific target  A1c;  LDL, HDL, Triglycerides, and  Waist Circumference were discussed in detail.  2) BP/HTN: Uncontrolled. Continue current medications including ACEI/ARB. 3) Lipids/HPL: Lipid panel unknown, he is not on statins. I will obtain fasting lipid panel on his subsequent visits. 4)  Weight/Diet: CDE Consult will be  initiated , exercise, and detailed carbohydrates information provided.  5) Chronic Care/Health Maintenance:  -Patient is on ACEI/ARB  medications and encouraged to continue to follow up with Ophthalmology, Podiatrist at least yearly or according to recommendations, and advised to  quit stay away from smoking. I have recommended yearly flu vaccine and pneumonia vaccination at least every 5 years; moderate intensity exercise for up to 150 minutes weekly; and  sleep for at least 7 hours a day.  -25 minutes of time was spent on the care of this patient , 50% of which was applied for counseling on diabetes complications and their preventions.  - Patient to bring meter and  blood glucose logs during their next visit.   - I advised patient to maintain  close follow up with Ignatius Specking, MD for primary care needs.  Follow up plan: - Return in about 2 weeks (around 08/09/2016) for meter, and logs, follow up with pre-visit labs, meter, and logs.  Marquis Lunch, MD Phone: [phone]  Fax: [phone]   07/26/2016, 10:55 AM

## 2016-07-26 NOTE — Patient Instructions (Signed)

## 2016-07-26 NOTE — Patient Instructions (Addendum)
Goals 1. Adriana SimasCook a few pieces of meat at one time and use for a few meals 2.  Increase fresh fruits and vegetables 3. Drink water 4. Only eat "rabibit food" for snacks 5. Call DR Fransico HimNida if  BS over 300 at any time 6. Go by social security office to discuss duplicate insurance 7. Will check on medication assistance with insulin company

## 2016-08-02 ENCOUNTER — Ambulatory Visit: Payer: Self-pay | Admitting: Nutrition

## 2016-08-03 LAB — HEMOGLOBIN A1C: Hemoglobin A1C: 12.2

## 2016-08-10 ENCOUNTER — Encounter: Payer: Self-pay | Admitting: "Endocrinology

## 2016-08-13 ENCOUNTER — Encounter: Payer: Medicare HMO | Attending: "Endocrinology | Admitting: Nutrition

## 2016-08-13 ENCOUNTER — Ambulatory Visit (INDEPENDENT_AMBULATORY_CARE_PROVIDER_SITE_OTHER): Payer: Medicare HMO | Admitting: "Endocrinology

## 2016-08-13 ENCOUNTER — Encounter: Payer: Self-pay | Admitting: "Endocrinology

## 2016-08-13 VITALS — Ht 68.0 in | Wt 301.0 lb

## 2016-08-13 VITALS — BP 173/93 | HR 104 | Ht 67.5 in | Wt 301.0 lb

## 2016-08-13 DIAGNOSIS — E118 Type 2 diabetes mellitus with unspecified complications: Secondary | ICD-10-CM | POA: Insufficient documentation

## 2016-08-13 DIAGNOSIS — IMO0002 Reserved for concepts with insufficient information to code with codable children: Secondary | ICD-10-CM

## 2016-08-13 DIAGNOSIS — I10 Essential (primary) hypertension: Secondary | ICD-10-CM

## 2016-08-13 DIAGNOSIS — Z91199 Patient's noncompliance with other medical treatment and regimen due to unspecified reason: Secondary | ICD-10-CM | POA: Insufficient documentation

## 2016-08-13 DIAGNOSIS — Z794 Long term (current) use of insulin: Secondary | ICD-10-CM | POA: Diagnosis not present

## 2016-08-13 DIAGNOSIS — Z9119 Patient's noncompliance with other medical treatment and regimen: Secondary | ICD-10-CM | POA: Diagnosis not present

## 2016-08-13 DIAGNOSIS — E1165 Type 2 diabetes mellitus with hyperglycemia: Secondary | ICD-10-CM | POA: Insufficient documentation

## 2016-08-13 DIAGNOSIS — Z713 Dietary counseling and surveillance: Secondary | ICD-10-CM | POA: Insufficient documentation

## 2016-08-13 MED ORDER — "INSULIN SYRINGE 29G X 1/2"" 1 ML MISC": 2 refills | Status: DC

## 2016-08-13 MED ORDER — EMPAGLIFLOZIN 10 MG PO TABS: 10.0000 mg | ORAL_TABLET | Freq: Every day | ORAL | 2 refills | Status: DC

## 2016-08-13 MED ORDER — INSULIN NPH ISOPHANE & REGULAR (70-30) 100 UNIT/ML ~~LOC~~ SUSP: 80.0000 [IU] | Freq: Two times a day (BID) | SUBCUTANEOUS | 2 refills | Status: DC

## 2016-08-13 NOTE — Patient Instructions (Addendum)
Goals 1. Start measuring foods out for better accurate portion sizes. 2. Increase fresh fruits and more lower carb vegetables. 3. No snacks 4. Avoid processed foods. 5. Continue water aerobics. 6. Lose 1-2 lbs per week Get A1C down to 7%

## 2016-08-13 NOTE — Progress Notes (Signed)
Subjective:    Patient ID: Jeff Garcia, male    DOB: [birth year]. Patient is being seen in f/u for management of diabetes requested by  Ignatius Specking, MD  Past Medical History:  Diagnosis Date  . Asthma   . BPH (benign prostatic hyperplasia)   . COPD (chronic obstructive pulmonary disease) (HCC)   . Diabetes mellitus without complication (HCC)   . Hyperlipidemia   . Hypertension   . Neuropathy (HCC)   . Stroke [facility])    History reviewed. No pertinent surgical history. Social History   Social History  . Marital status: Single    Spouse name: N/A  . Number of children: N/A  . Years of education: N/A   Social History Main Topics  . Smoking status: Current Every Day Smoker  . Smokeless tobacco: Never Used  . Alcohol use 0.0 oz/week  . Drug use:     Types: Cocaine  . Sexual activity: Not Asked   Other Topics Concern  . None   Social History Narrative  . None   Outpatient Encounter Prescriptions as of 08/13/2016  Medication Sig  . ALPRAZolam (XANAX) 0.5 MG tablet Take 0.5 mg by mouth at bedtime as needed for anxiety.  Marland Kitchen aspirin 81 MG tablet Take 81 mg by mouth daily.  . Cholecalciferol (VITAMIN D) 2000 units CAPS Take by mouth.  . empagliflozin (JARDIANCE) 10 MG TABS tablet Take 10 mg by mouth daily.  . furosemide (LASIX) 40 MG tablet Take 40 mg by mouth daily.  . insulin NPH-regular Human (NOVOLIN 70/30 RELION) (70-30) 100 UNIT/ML injection Inject 80 Units into the skin 2 (two) times daily with a meal.  . INSULIN SYRINGE 1CC/29G 29G X 1/2" 1 ML MISC Use as directed twice a day to inject insulin.  Marland Kitchen lisinopril (PRINIVIL,ZESTRIL) 10 MG tablet Take 10 mg by mouth daily.  . Misc Natural Products (PROSTATE HEALTH PO) Take by mouth.  . potassium chloride (K-DUR) 10 MEQ tablet Take 10 mEq by mouth daily.  . tamsulosin (FLOMAX) 0.4 MG CAPS capsule Take 0.4 mg by mouth.  . vitamin E 1000 UNIT capsule Take 1,000 Units by mouth daily.  . [DISCONTINUED] insulin NPH-regular Human  (NOVOLIN 70/30 RELION) (70-30) 100 UNIT/ML injection Inject 60-70 Units into the skin 2 (two) times daily with a meal. Inject 70 units with breakfast and 60 units with supper only if blood glucose is above 90 and you are about to eat. (Patient taking differently: Inject 60-70 Units into the skin 2 (two) times daily with a meal. Inject 80 units with breakfast and 70 units with supper only if blood glucose is above 90 and you are about to eat.)  . [DISCONTINUED] INSULIN SYRINGE .5CC/29G 29G X 1/2" 0.5 ML MISC Use to inject insulin 2 x a day   No facility-administered encounter medications on file as of 08/13/2016.    ALLERGIES: No Known Allergies VACCINATION STATUS:  There is no immunization history on file for this patient.  Diabetes  He presents for his follow-up diabetic visit. He has type 2 diabetes mellitus. Onset time: He was diagnosed at approximate age of 70 years. His disease course has been worsening. There are no hypoglycemic associated symptoms. Pertinent negatives for hypoglycemia include no confusion, headaches, pallor or seizures. Associated symptoms include blurred vision, polydipsia and polyuria. Pertinent negatives for diabetes include no chest pain, no fatigue, no polyphagia and no weakness. There are no hypoglycemic complications. Symptoms are worsening. Diabetic complications include a CVA. Risk factors for coronary artery  disease include diabetes mellitus, dyslipidemia, family history, hypertension, male sex, obesity, sedentary lifestyle and tobacco exposure. Current diabetic treatment includes insulin injections (He changed around the doses of NovoLog 70/30 taking up to 120 units at times despite my advice for him to take 60 units twice a day with breakfast and supper.). He is compliant with treatment some of the time. His weight is decreasing steadily. He is following a generally unhealthy diet. He has not had a previous visit with a dietitian. He never participates in exercise. His  breakfast blood glucose range is generally 130-140 mg/dl. His lunch blood glucose range is generally >200 mg/dl. His dinner blood glucose range is generally >200 mg/dl. His overall blood glucose range is >200 mg/dl. An ACE inhibitor/angiotensin II receptor blocker is being taken.  Hypertension  This is a chronic problem. The current episode started more than 1 year ago. The problem is uncontrolled. Associated symptoms include blurred vision. Pertinent negatives include no chest pain, headaches, neck pain, palpitations or shortness of breath. Risk factors for coronary artery disease include dyslipidemia, diabetes mellitus, obesity, male gender, family history, smoking/tobacco exposure and sedentary lifestyle. Past treatments include ACE inhibitors. Hypertensive end-organ damage includes CVA.   Review of Systems  Constitutional: Negative for chills, fatigue, fever and unexpected weight change.  HENT: Negative for dental problem, mouth sores and trouble swallowing.   Eyes: Positive for blurred vision. Negative for visual disturbance.  Respiratory: Negative for cough, choking, chest tightness, shortness of breath and wheezing.   Cardiovascular: Negative for chest pain, palpitations and leg swelling.  Gastrointestinal: Negative for abdominal distention, abdominal pain, constipation, diarrhea, nausea and vomiting.  Endocrine: Positive for polydipsia and polyuria. Negative for polyphagia.  Genitourinary: Negative for dysuria, flank pain, hematuria and urgency.  Musculoskeletal: Negative for back pain, gait problem, myalgias and neck pain.  Skin: Negative for pallor, rash and wound.  Neurological: Negative for seizures, syncope, weakness, numbness and headaches.  Psychiatric/Behavioral: Negative.  Negative for confusion and dysphoric mood.    Objective:    BP (!) 173/93   Pulse (!) 104   Ht 5' 7.5" (1.715 m)   Wt (!) 301 lb (136.5 kg)   BMI 46.45 kg/m   Wt Readings from Last 3 Encounters:   08/13/16 (!) 301 lb (136.5 kg)  07/26/16 (!) 308 lb (139.7 kg)  07/26/16 (!) 308 lb (139.7 kg)    Physical Exam  Constitutional: He is oriented to person, place, and time. He appears well-developed and well-nourished. He is cooperative. No distress.  HENT:  Head: Normocephalic and atraumatic.  Eyes: EOM are normal.  Neck: Normal range of motion. Neck supple. No tracheal deviation present. No thyromegaly present.  Cardiovascular: Normal rate, S1 normal, S2 normal and normal heart sounds.  Exam reveals no gallop.   No murmur heard. Pulses:      Dorsalis pedis pulses are 1+ on the right side, and 1+ on the left side.       Posterior tibial pulses are 1+ on the right side, and 1+ on the left side.  Pulmonary/Chest: Breath sounds normal. No respiratory distress. He has no wheezes.  Abdominal: Soft. Bowel sounds are normal. He exhibits no distension. There is no tenderness. There is no guarding and no CVA tenderness.  Musculoskeletal: He exhibits no edema.       Right shoulder: He exhibits no swelling and no deformity.  Neurological: He is alert and oriented to person, place, and time. He has normal strength and normal reflexes. No cranial nerve deficit or  sensory deficit. Gait normal.  Skin: Skin is warm and dry. No rash noted. No cyanosis. Nails show no clubbing.  Psychiatric: He has a normal mood and affect. His speech is normal and behavior is normal. Judgment and thought content normal. Cognition and memory are normal.   He did not go for the ordered labs before this visit.  Assessment & Plan:   1. Uncontrolled type 2 diabetes mellitus with complication, with long-term current use of insulin (HCC)   - Patient has currently uncontrolled symptomatic type 2 DM since  70 years of age. His recent A1c is high at 12.2%.  -He does not have recent labs to review his renal function. - He has lab orders in place to do sometime this week.  His diabetes is complicated by CVA, noncompliance,  obesity, unaffordable medications and patient remains at a high risk for more acute and chronic complications of diabetes which include CAD, CVA, CKD, retinopathy, and neuropathy. These are all discussed in detail with the patient.  - I have counseled the patient on diet management and weight loss, by adopting a carbohydrate restricted/protein rich diet.  - Suggestion is made for patient to avoid simple carbohydrates   from their diet including Cakes , Desserts, Ice Cream,  Soda (  diet and regular) , Sweet Tea , Candies,  Chips, Cookies, Artificial Sweeteners,   and "Sugar-free" Products . This will help patient to have stable blood glucose profile and potentially avoid unintended weight gain.  - I encouraged the patient to switch to  unprocessed or minimally processed complex starch and increased protein intake (animal or plant source), fruits, and vegetables.  - Patient is advised to stick to a routine mealtimes to eat 3 meals  a day and avoid unnecessary snacks ( to snack only to correct hypoglycemia).  - The patient will be scheduled with Norm SaltPenny Crumpton, RDN, CDE for individualized DM education.  - I have approached patient with the following individualized plan to manage diabetes and patient agrees:    - He came with still significantly above target blood glucose profile. This is mainly due to his dietary indiscretion and misreading of his insulin instructions.  - He could not afford insulin analogs. - He will have his diabetes education today.  -I  I have approached him to continue with  premixed insulin  Novolin 70/30  80 units with breakfast and 80 units with supper  for pre-meal blood glucose above 90 mg/dL.   - He is advised to continue strict monitoring of blood glucose before meals and at bedtime.   -Patient is encouraged to call clinic for blood glucose levels less than 70 or above 300 mg /dl. - He mentions intolerance to metformin and other unidentified medications.  However,  since his renal function is favorable, he will benefit from low-dose ace SGLT 2 inhibitor- I will initiate Jardiance 10 mg by mouth every morning. Side effects and precautions discussed with him.   - Patient specific target  A1c;  LDL, HDL, Triglycerides, and  Waist Circumference were discussed in detail.  2) BP/HTN: Uncontrolled. Continue current medications including ACEI/ARB. 3) Lipids/HPL:  controlled LDL at 50 .  4)  Weight/Diet: CDE Consult  in progress  , exercise, and detailed carbohydrates information provided.  5) Chronic Care/Health Maintenance:  -Patient is on ACEI/ARB  medications and encouraged to continue to follow up with Ophthalmology, Podiatrist at least yearly or according to recommendations, and advised to  quit stay away from smoking. I have recommended yearly  flu vaccine and pneumonia vaccination at least every 5 years; moderate intensity exercise for up to 150 minutes weekly; and  sleep for at least 7 hours a day.  -25 minutes of time was spent on the care of this patient , 50% of which was applied for counseling on diabetes complications and their preventions.  - Patient to bring meter and  blood glucose logs during their next visit.   - I advised patient to maintain close follow up with Ignatius Specking, MD for primary care needs.  Follow up plan: - Return in about 3 months (around 11/12/2016) for follow up with pre-visit labs, meter, and logs.  Marquis Lunch, MD Phone: [phone]  Fax: [phone]   08/13/2016, 12:02 PM

## 2016-08-13 NOTE — Patient Instructions (Signed)

## 2016-08-13 NOTE — Progress Notes (Signed)
  Medical Nutrition Therapy:  Appt start time: 1130 end time:  1200 Assessment:  Primary concerns today: DM  Had been out of insulin for a few days at times..  Water aerobics three times per week. BS still poorly controlled. Diet reveals his portion remain excessive. He is making attempts at eating more fresh fruits and vegetables. Lost 6-7 lbs. BS remain elevated > 200 most of the time. But he has had some FBS less than 100 mg/dl. Saw Dr. Fransico HimNida today and started [place]ard[place].   He is going through a lot of emotional stress due to personal/family issues.     70/30 80 units BID.    Lab Results  Component Value Date   HGBA1C 9.9 04/09/2016    Preferred Learning Style:     No preference indicated   Learning Readiness:   Ready  Change in progress   MEDICATIONS: see list   DIETARY INTAKE:   24-hr recall:  B ( AM): Shredded wheat OR raisin bran with egg, water,  Snk ( AM): none  L ( PM):   Toss salad with chicken with ranch with vinegar  With baked potato, Snk ( PM): none D ( PM):  Chicken, salad Beverages: juice, water, crystal light  Usual physical activity: water aerobics  Estimated energy needs: 1800 calories 200 g carbohydrates 135 g protein 50 g fat  Progress Towards Goal(s):  In progress.   Nutritional Diagnosis:  NB-1.1 Food and nutrition-related knowledge deficit As related to Diabetes.  As evidenced by A1C.    Intervention:  Nutrition and Diabetes education provided on My Plate, CHO counting, meal planning, portion sizes, timing of meals, avoiding snacks between meals unless having a low blood sugar, target ranges for A1C and blood sugars, signs/symptoms and treatment of hyper/hypoglycemia, monitoring blood sugars, taking medications as prescribed, benefits of exercising 30 minutes per day and prevention of complications of DM.   Goals  1. Start measuring foods out for better accurate portion sizes. 2. Increase fresh fruits and more lower carb vegetables. 3.  No snacks 4. Avoid processed foods. 5. Continue water aerobics. 6. Lose 1-2 lbs per week Get A1C down to 7%   Teaching Method Utilized: Visual Auditory Hands on  Handouts given during visit include:  The Plate Method   Meal Plan Card  Diabetes Instructions.   Barriers to learning/adherence to lifestyle change:  None  Demonstrated degree of understanding via:  Teach Back   Monitoring/Evaluation:  Dietary intake, exercise, meal planning, BG, and body weight in 2-3 months.  He would benefit from MDI but he can't afford them at this time.

## 2016-08-15 ENCOUNTER — Encounter: Payer: Self-pay | Admitting: Nutrition

## 2016-08-23 ENCOUNTER — Telehealth: Payer: Self-pay

## 2016-08-23 NOTE — Telephone Encounter (Signed)
No change, continue to follow instructions, eat on time and inject insulin only if blood glucose is above 90 with breakfast and supper. Call back if readings are lower than 70 times 3 or greater than 300 times 3.

## 2016-08-23 NOTE — Telephone Encounter (Signed)
Pt states he has had high and low BG readings.   Date Before breakfast Before lunch Before supper Bedtime  9/26 146  280 206  9/27 126 / 69 at 6a.m. 280 305 339  9/28 337             Pt taking: Novolin 70/30 80 bid

## 2016-08-24 NOTE — Telephone Encounter (Signed)
Pt.notified

## 2016-08-28 ENCOUNTER — Encounter: Payer: Self-pay | Admitting: "Endocrinology

## 2016-09-19 ENCOUNTER — Telehealth: Payer: Self-pay | Admitting: "Endocrinology

## 2016-09-19 NOTE — Telephone Encounter (Signed)
Patient left a message on VM stating he has high blood sugar readings. Please call the patient.

## 2016-09-19 NOTE — Telephone Encounter (Signed)
Notified pt by voice mail that we need BG readings to give to Dr Fransico HimNida. Explained that he could either call with these or bring his BG logs to the office for review.

## 2016-09-20 NOTE — Telephone Encounter (Signed)
Left message for pt to call us back. 

## 2016-09-20 NOTE — Telephone Encounter (Signed)
Patient needs to come to clinic on Monday with his meter and logs and any leftover insulin at home. He will be switched to different kinds of insulin. In the meantime he will continue with Novolin 70/30 90 units twice a day only with breakfast and supper, start monitoring blood glucose before meals and at bedtime (4 times a day) and bring his meter and logs with him.

## 2016-09-20 NOTE — Telephone Encounter (Signed)
Pt states he has had high BG readings.   Date Before breakfast Before lunch Before supper Bedtime  10/23 194   80 units 55 297   80 units 172  10/24 72     No Insulin  457   80 units 405  10/25 125   80 units 320 362    80units 317  40 units  10/26 92      80 units       Pt taking:  Novolin 70/30 80 units bid, Jardiance 10 mg qd

## 2016-09-21 NOTE — Telephone Encounter (Signed)
Pt states he can come Tuesday Oct 31st. He will be here at 1:00 with logs / meters

## 2016-09-25 ENCOUNTER — Encounter: Payer: Self-pay | Admitting: "Endocrinology

## 2016-09-25 ENCOUNTER — Ambulatory Visit (INDEPENDENT_AMBULATORY_CARE_PROVIDER_SITE_OTHER): Payer: Medicare HMO | Admitting: "Endocrinology

## 2016-09-25 VITALS — BP 137/82 | HR 93 | Ht 67.5 in | Wt 308.0 lb

## 2016-09-25 DIAGNOSIS — I10 Essential (primary) hypertension: Secondary | ICD-10-CM

## 2016-09-25 DIAGNOSIS — E1165 Type 2 diabetes mellitus with hyperglycemia: Secondary | ICD-10-CM | POA: Diagnosis not present

## 2016-09-25 DIAGNOSIS — E118 Type 2 diabetes mellitus with unspecified complications: Secondary | ICD-10-CM | POA: Diagnosis not present

## 2016-09-25 DIAGNOSIS — Z9119 Patient's noncompliance with other medical treatment and regimen: Secondary | ICD-10-CM | POA: Diagnosis not present

## 2016-09-25 DIAGNOSIS — Z794 Long term (current) use of insulin: Secondary | ICD-10-CM

## 2016-09-25 DIAGNOSIS — IMO0002 Reserved for concepts with insufficient information to code with codable children: Secondary | ICD-10-CM

## 2016-09-25 DIAGNOSIS — Z91199 Patient's noncompliance with other medical treatment and regimen due to unspecified reason: Secondary | ICD-10-CM

## 2016-09-25 NOTE — Progress Notes (Signed)
Subjective:    Patient ID: Jeff Garcia, male    DOB: Nov 01, 1946. Patient is being seen in f/u for management of diabetes requested by  Ignatius Specking, MD  Past Medical History:  Diagnosis Date  . Asthma   . BPH (benign prostatic hyperplasia)   . COPD (chronic obstructive pulmonary disease) (HCC)   . Diabetes mellitus without complication (HCC)   . Hyperlipidemia   . Hypertension   . Neuropathy (HCC)   . Stroke [facility])    History reviewed. No pertinent surgical history. Social History   Social History  . Marital status: Single    Spouse name: N/A  . Number of children: N/A  . Years of education: N/A   Social History Main Topics  . Smoking status: Current Every Day Smoker  . Smokeless tobacco: Never Used  . Alcohol use 0.0 oz/week  . Drug use:     Types: Cocaine  . Sexual activity: Not Asked   Other Topics Concern  . None   Social History Narrative  . None   Outpatient Encounter Prescriptions as of 09/25/2016  Medication Sig  . ALPRAZolam (XANAX) 0.5 MG tablet Take 0.5 mg by mouth at bedtime as needed for anxiety.  Marland Kitchen aspirin 81 MG tablet Take 81 mg by mouth daily.  . Cholecalciferol (VITAMIN D) 2000 units CAPS Take by mouth.  . furosemide (LASIX) 40 MG tablet Take 40 mg by mouth daily.  . insulin NPH-regular Human (NOVOLIN 70/30 RELION) (70-30) 100 UNIT/ML injection Inject 80 Units into the skin 2 (two) times daily with a meal.  . INSULIN SYRINGE 1CC/29G 29G X 1/2" 1 ML MISC Use as directed twice a day to inject insulin.  Marland Kitchen lisinopril (PRINIVIL,ZESTRIL) 10 MG tablet Take 10 mg by mouth daily.  . Misc Natural Products (PROSTATE HEALTH PO) Take by mouth.  . potassium chloride (K-DUR) 10 MEQ tablet Take 10 mEq by mouth daily.  . tamsulosin (FLOMAX) 0.4 MG CAPS capsule Take 0.4 mg by mouth.  . vitamin E 1000 UNIT capsule Take 1,000 Units by mouth daily.  . [DISCONTINUED] empagliflozin (JARDIANCE) 10 MG TABS tablet Take 10 mg by mouth daily. (Patient not taking:  Reported on 09/25/2016)   No facility-administered encounter medications on file as of 09/25/2016.    ALLERGIES: No Known Allergies VACCINATION STATUS:  There is no immunization history on file for this patient.  Diabetes  He presents for his follow-up diabetic visit. He has type 2 diabetes mellitus. Onset time: He was diagnosed at approximate age of 48 years. His disease course has been improving. There are no hypoglycemic associated symptoms. Pertinent negatives for hypoglycemia include no confusion, headaches, pallor or seizures. Associated symptoms include blurred vision, polydipsia and polyuria. Pertinent negatives for diabetes include no chest pain, no fatigue, no polyphagia and no weakness. There are no hypoglycemic complications. Symptoms are improving. Diabetic complications include a CVA. Risk factors for coronary artery disease include diabetes mellitus, dyslipidemia, family history, hypertension, male sex, obesity, sedentary lifestyle and tobacco exposure. Current diabetic treatment includes insulin injections (He changed around the doses of NovoLog 70/30 taking up to 120 units at times despite my advice for him to take 60 units twice a day with breakfast and supper.). He is compliant with treatment some of the time. His weight is decreasing steadily. He is following a generally unhealthy diet. He has not had a previous visit with a dietitian. He never participates in exercise. His breakfast blood glucose range is generally 180-200 mg/dl. His lunch blood  glucose range is generally 140-180 mg/dl. His dinner blood glucose range is generally >200 mg/dl. His overall blood glucose range is 180-200 mg/dl. An ACE inhibitor/angiotensin II receptor blocker is being taken.  Hypertension  This is a chronic problem. The current episode started more than 1 year ago. The problem is uncontrolled. Associated symptoms include blurred vision. Pertinent negatives include no chest pain, headaches, neck pain,  palpitations or shortness of breath. Risk factors for coronary artery disease include dyslipidemia, diabetes mellitus, obesity, male gender, family history, smoking/tobacco exposure and sedentary lifestyle. Past treatments include ACE inhibitors. Hypertensive end-organ damage includes CVA.   Review of Systems  Constitutional: Negative for chills, fatigue, fever and unexpected weight change.  HENT: Negative for dental problem, mouth sores and trouble swallowing.   Eyes: Positive for blurred vision. Negative for visual disturbance.  Respiratory: Negative for cough, choking, chest tightness, shortness of breath and wheezing.   Cardiovascular: Negative for chest pain, palpitations and leg swelling.  Gastrointestinal: Negative for abdominal distention, abdominal pain, constipation, diarrhea, nausea and vomiting.  Endocrine: Positive for polydipsia and polyuria. Negative for polyphagia.  Genitourinary: Negative for dysuria, flank pain, hematuria and urgency.  Musculoskeletal: Negative for back pain, gait problem, myalgias and neck pain.  Skin: Negative for pallor, rash and wound.  Neurological: Negative for seizures, syncope, weakness, numbness and headaches.  Psychiatric/Behavioral: Negative.  Negative for confusion and dysphoric mood.    Objective:    BP 137/82   Pulse 93   Ht 5' 7.5" (1.715 m)   Wt (!) 308 lb (139.7 kg)   BMI 47.53 kg/m   Wt Readings from Last 3 Encounters:  09/25/16 (!) 308 lb (139.7 kg)  08/13/16 (!) 301 lb (136.5 kg)  08/13/16 (!) 301 lb (136.5 kg)    Physical Exam  Constitutional: He is oriented to person, place, and time. He appears well-developed and well-nourished. He is cooperative. No distress.  HENT:  Head: Normocephalic and atraumatic.  Eyes: EOM are normal.  Neck: Normal range of motion. Neck supple. No tracheal deviation present. No thyromegaly present.  Cardiovascular: Normal rate, S1 normal, S2 normal and normal heart sounds.  Exam reveals no gallop.    No murmur heard. Pulses:      Dorsalis pedis pulses are 1+ on the right side, and 1+ on the left side.       Posterior tibial pulses are 1+ on the right side, and 1+ on the left side.  Pulmonary/Chest: Breath sounds normal. No respiratory distress. He has no wheezes.  Abdominal: Soft. Bowel sounds are normal. He exhibits no distension. There is no tenderness. There is no guarding and no CVA tenderness.  Musculoskeletal: He exhibits no edema.       Right shoulder: He exhibits no swelling and no deformity.  Neurological: He is alert and oriented to person, place, and time. He has normal strength and normal reflexes. No cranial nerve deficit or sensory deficit. Gait normal.  Skin: Skin is warm and dry. No rash noted. No cyanosis. Nails show no clubbing.  Psychiatric: He has a normal mood and affect. His speech is normal and behavior is normal. Judgment and thought content normal. Cognition and memory are normal.   He did not go for the ordered labs before this visit.  Assessment & Plan:   1. Uncontrolled type 2 diabetes mellitus with complication, with long-term current use of insulin (HCC)   - Patient has currently uncontrolled symptomatic type 2 DM since  70 years of age. His recent A1c is high  at 12.2%.  -He does not have recent labs to review his renal function. - He has lab orders in place to do sometime this week.  His diabetes is complicated by CVA, noncompliance, obesity, unaffordable medications and patient remains at a high risk for more acute and chronic complications of diabetes which include CAD, CVA, CKD, retinopathy, and neuropathy. These are all discussed in detail with the patient.  - I have counseled the patient on diet management and weight loss, by adopting a carbohydrate restricted/protein rich diet.  - Suggestion is made for patient to avoid simple carbohydrates   from their diet including Cakes , Desserts, Ice Cream,  Soda (  diet and regular) , Sweet Tea , Candies,   Chips, Cookies, Artificial Sweeteners,   and "Sugar-free" Products . This will help patient to have stable blood glucose profile and potentially avoid unintended weight gain.  - I encouraged the patient to switch to  unprocessed or minimally processed complex starch and increased protein intake (animal or plant source), fruits, and vegetables.  - Patient is advised to stick to a routine mealtimes to eat 3 meals  a day and avoid unnecessary snacks ( to snack only to correct hypoglycemia).  - The patient will be scheduled with Norm SaltPenny Crumpton, RDN, CDE for individualized DM education.  - I have approached patient with the following individualized plan to manage diabetes and patient agrees:    - He came with still significantly above target and significantly fluctuating blood glucose profile. This is mainly due to his dietary indiscretion. - He could not afford insulin analogs, nor SGLT2 inhibitors.   - Due to cost associated with his diabetes regimen, Novolin 70/30 is still the best option for him.  - This incident should work if he takes care of dietary modification part.  - I had a long discussion with him to maximize his caloric/carb restriction and I  I have approached him to continue with  premixed insulin  Novolin 70/30  80 units with breakfast and 80 units with supper  for pre-meal blood glucose above 90 mg/dL.   - He is advised to continue strict monitoring of blood glucose before meals and at bedtime.   -Patient is encouraged to call clinic for blood glucose levels less than 70 or above 300 mg /dl. - He mentions intolerance to metformin and other unidentified medications.   - Patient specific target  A1c;  LDL, HDL, Triglycerides, and  Waist Circumference were discussed in detail.  2) BP/HTN: Uncontrolled. Continue current medications including ACEI/ARB. 3) Lipids/HPL:  controlled LDL at 50 .  4)  Weight/Diet: CDE Consult  in progress  , exercise, and detailed carbohydrates  information provided.  5) Chronic Care/Health Maintenance:  -Patient is on ACEI/ARB  medications and encouraged to continue to follow up with Ophthalmology, Podiatrist at least yearly or according to recommendations, and advised to  quit stay away from smoking. I have recommended yearly flu vaccine and pneumonia vaccination at least every 5 years; moderate intensity exercise for up to 150 minutes weekly; and  sleep for at least 7 hours a day.  -25 minutes of time was spent on the care of this patient , 50% of which was applied for counseling on diabetes complications and their preventions.  - Patient to bring meter and  blood glucose logs during their next visit.   - I advised patient to maintain close follow up with Ignatius Speckinghruv B Vyas, MD for primary care needs.  Follow up plan: - Return in  about 8 weeks (around 11/20/2016) for meter, and logs.  Marquis Lunch, MD Phone: [phone]  Fax: [phone]   09/25/2016, 11:25 AM

## 2016-09-25 NOTE — Patient Instructions (Signed)

## 2016-10-01 ENCOUNTER — Telehealth: Payer: Self-pay | Admitting: "Endocrinology

## 2016-10-01 NOTE — Telephone Encounter (Signed)
He's having high readings 406, 488, 311 and wants to know what to do...kiki

## 2016-10-01 NOTE — Telephone Encounter (Signed)
Called pt. Message states "unable to take calls at this time."

## 2016-10-02 NOTE — Telephone Encounter (Signed)
Message on pts phone states "unable to take calls at this time"

## 2016-10-03 NOTE — Telephone Encounter (Signed)
Spoke with pt. He could not give me 3 days of readings at the time. States he will call back with these. He states his phone is broke. Changed his # in demographics.

## 2016-10-04 NOTE — Telephone Encounter (Signed)
His suppertime glucose readings do not seem to be pre-meal, please advise him to monitor only before food. No change on his insulin for now, continue Novolin 70/ 30,  80 units with breakfast and with supper when pre-meal blood glucose is above 90 mg/dL.

## 2016-10-04 NOTE — Telephone Encounter (Signed)
Pt states he has had high BG readings.   Date Before breakfast Before lunch Before supper Bedtime  11/7 63 184 314 HI  11/8 121 106 227     9:17 570   12a.m.  11/9 175 76            Pt taking: Novolin 70/30 80 units bid

## 2016-10-05 NOTE — Telephone Encounter (Signed)
Called pt - "Mailbox full."

## 2016-10-08 ENCOUNTER — Encounter: Payer: Medicare HMO | Attending: "Endocrinology | Admitting: Nutrition

## 2016-10-08 DIAGNOSIS — Z713 Dietary counseling and surveillance: Secondary | ICD-10-CM | POA: Insufficient documentation

## 2016-10-08 DIAGNOSIS — E118 Type 2 diabetes mellitus with unspecified complications: Secondary | ICD-10-CM | POA: Insufficient documentation

## 2016-10-08 DIAGNOSIS — E1165 Type 2 diabetes mellitus with hyperglycemia: Secondary | ICD-10-CM | POA: Insufficient documentation

## 2016-10-08 DIAGNOSIS — Z794 Long term (current) use of insulin: Secondary | ICD-10-CM | POA: Insufficient documentation

## 2016-10-08 NOTE — Telephone Encounter (Signed)
Called pt. No answer °

## 2016-10-09 NOTE — Telephone Encounter (Signed)
Called pt. No answer °

## 2016-10-11 NOTE — Telephone Encounter (Signed)
Pt notified and agrees. 

## 2016-11-12 ENCOUNTER — Encounter: Payer: Self-pay | Admitting: "Endocrinology

## 2016-11-12 LAB — HEMOGLOBIN A1C: Hemoglobin A1C: 11.6

## 2016-11-13 ENCOUNTER — Ambulatory Visit: Payer: Medicare HMO | Admitting: "Endocrinology

## 2016-11-21 ENCOUNTER — Ambulatory Visit: Payer: Medicare HMO | Admitting: "Endocrinology

## 2016-12-11 ENCOUNTER — Encounter: Payer: Self-pay | Admitting: "Endocrinology

## 2016-12-11 ENCOUNTER — Ambulatory Visit (INDEPENDENT_AMBULATORY_CARE_PROVIDER_SITE_OTHER): Payer: Medicare HMO | Admitting: "Endocrinology

## 2016-12-11 VITALS — BP 177/84 | HR 94 | Ht 67.5 in | Wt 309.0 lb

## 2016-12-11 DIAGNOSIS — I10 Essential (primary) hypertension: Secondary | ICD-10-CM | POA: Diagnosis not present

## 2016-12-11 DIAGNOSIS — Z794 Long term (current) use of insulin: Secondary | ICD-10-CM | POA: Diagnosis not present

## 2016-12-11 DIAGNOSIS — IMO0002 Reserved for concepts with insufficient information to code with codable children: Secondary | ICD-10-CM

## 2016-12-11 DIAGNOSIS — E118 Type 2 diabetes mellitus with unspecified complications: Secondary | ICD-10-CM | POA: Diagnosis not present

## 2016-12-11 DIAGNOSIS — E1165 Type 2 diabetes mellitus with hyperglycemia: Secondary | ICD-10-CM

## 2016-12-11 NOTE — Patient Instructions (Signed)

## 2016-12-11 NOTE — Progress Notes (Signed)
Subjective:    Patient ID: Jeff Garcia, male    DOB: 12/15/1945. Patient is being seen in f/u for management of diabetes requested by  Ignatius Specking, MD  Past Medical History:  Diagnosis Date  . Asthma   . BPH (benign prostatic hyperplasia)   . COPD (chronic obstructive pulmonary disease) (HCC)   . Diabetes mellitus without complication (HCC)   . Hyperlipidemia   . Hypertension   . Neuropathy (HCC)   . Stroke [facility])    History reviewed. No pertinent surgical history. Social History   Social History  . Marital status: Single    Spouse name: N/A  . Number of children: N/A  . Years of education: N/A   Social History Main Topics  . Smoking status: Current Every Day Smoker  . Smokeless tobacco: Never Used  . Alcohol use 0.0 oz/week  . Drug use:     Types: Cocaine  . Sexual activity: Not Asked   Other Topics Concern  . None   Social History Narrative  . None   Outpatient Encounter Prescriptions as of 12/11/2016  Medication Sig  . Insulin Aspart (NOVOLOG FLEXPEN [place]) Inject 15-21 Units into the skin 3 (three) times daily with meals.  . Insulin Glargine (LANTUS SOLOSTAR [place]) Inject 80 Units into the skin at bedtime.  . ALPRAZolam (XANAX) 0.5 MG tablet Take 0.5 mg by mouth at bedtime as needed for anxiety.  Marland Kitchen aspirin 81 MG tablet Take 81 mg by mouth daily.  . Cholecalciferol (VITAMIN D) 2000 units CAPS Take by mouth.  . furosemide (LASIX) 40 MG tablet Take 40 mg by mouth daily.  . INSULIN SYRINGE 1CC/29G 29G X 1/2" 1 ML MISC Use as directed twice a day to inject insulin.  Marland Kitchen lisinopril (PRINIVIL,ZESTRIL) 10 MG tablet Take 10 mg by mouth daily.  . Misc Natural Products (PROSTATE HEALTH PO) Take by mouth.  . potassium chloride (K-DUR) 10 MEQ tablet Take 10 mEq by mouth daily.  . tamsulosin (FLOMAX) 0.4 MG CAPS capsule Take 0.4 mg by mouth.  . vitamin E 1000 UNIT capsule Take 1,000 Units by mouth daily.  . [DISCONTINUED] insulin NPH-regular Human (NOVOLIN 70/30 [place])  (70-30) 100 UNIT/ML injection Inject 80 Units into the skin 2 (two) times daily with a meal.   No facility-administered encounter medications on file as of 12/11/2016.    ALLERGIES: No Known Allergies VACCINATION STATUS:  There is no immunization history on file for this patient.  Diabetes  He presents for his follow-up diabetic visit. He has type 2 diabetes mellitus. Onset time: He was diagnosed at approximate age of 48 years. His disease course has been improving. There are no hypoglycemic associated symptoms. Pertinent negatives for hypoglycemia include no confusion, headaches, pallor or seizures. Associated symptoms include blurred vision, polydipsia and polyuria. Pertinent negatives for diabetes include no chest pain, no fatigue, no polyphagia and no weakness. There are no hypoglycemic complications. Symptoms are improving. Diabetic complications include a CVA. Risk factors for coronary artery disease include diabetes mellitus, dyslipidemia, family history, hypertension, male sex, obesity, sedentary lifestyle and tobacco exposure. Current diabetic treatment includes insulin injections (He changed around the doses of NovoLog 70/30 taking up to 120 units at times despite my advice for him to take 60 units twice a day with breakfast and supper.). He is compliant with treatment some of the time. His weight is decreasing steadily. He is following a generally unhealthy diet. He has not had a previous visit with a dietitian. He never participates  in exercise. His breakfast blood glucose range is generally 180-200 mg/dl. His lunch blood glucose range is generally 140-180 mg/dl. His dinner blood glucose range is generally >200 mg/dl. His overall blood glucose range is 180-200 mg/dl. An ACE inhibitor/angiotensin II receptor blocker is being taken.  Hypertension  This is a chronic problem. The current episode started more than 1 year ago. The problem is uncontrolled. Associated symptoms include blurred vision.  Pertinent negatives include no chest pain, headaches, neck pain, palpitations or shortness of breath. Risk factors for coronary artery disease include dyslipidemia, diabetes mellitus, obesity, male gender, family history, smoking/tobacco exposure and sedentary lifestyle. Past treatments include ACE inhibitors. Hypertensive end-organ damage includes CVA.   Review of Systems  Constitutional: Negative for chills, fatigue, fever and unexpected weight change.  HENT: Negative for dental problem, mouth sores and trouble swallowing.   Eyes: Positive for blurred vision. Negative for visual disturbance.  Respiratory: Negative for cough, choking, chest tightness, shortness of breath and wheezing.   Cardiovascular: Negative for chest pain, palpitations and leg swelling.  Gastrointestinal: Negative for abdominal distention, abdominal pain, constipation, diarrhea, nausea and vomiting.  Endocrine: Positive for polydipsia and polyuria. Negative for polyphagia.  Genitourinary: Negative for dysuria, flank pain, hematuria and urgency.  Musculoskeletal: Negative for back pain, gait problem, myalgias and neck pain.  Skin: Negative for pallor, rash and wound.  Neurological: Negative for seizures, syncope, weakness, numbness and headaches.  Psychiatric/Behavioral: Negative.  Negative for confusion and dysphoric mood.    Objective:    BP (!) 177/84   Pulse 94   Ht 5' 7.5" (1.715 m)   Wt (!) 309 lb (140.2 kg)   BMI 47.68 kg/m   Wt Readings from Last 3 Encounters:  12/11/16 (!) 309 lb (140.2 kg)  09/25/16 (!) 308 lb (139.7 kg)  08/13/16 (!) 301 lb (136.5 kg)    Physical Exam  Constitutional: He is oriented to person, place, and time. He appears well-developed. He is cooperative. No distress.  Poor personal hygiene.  HENT:  Head: Normocephalic and atraumatic.  Eyes: EOM are normal.  Neck: Normal range of motion. Neck supple. No tracheal deviation present. No thyromegaly present.  Cardiovascular: Normal  rate, S1 normal, S2 normal and normal heart sounds.  Exam reveals no gallop.   No murmur heard. Pulses:      Dorsalis pedis pulses are 1+ on the right side, and 1+ on the left side.       Posterior tibial pulses are 1+ on the right side, and 1+ on the left side.  Pulmonary/Chest: Breath sounds normal. No respiratory distress. He has no wheezes.  Abdominal: Soft. Bowel sounds are normal. He exhibits no distension. There is no tenderness. There is no guarding and no CVA tenderness.  Musculoskeletal: He exhibits no edema.       Right shoulder: He exhibits no swelling and no deformity.  Neurological: He is alert and oriented to person, place, and time. He has normal strength and normal reflexes. No cranial nerve deficit or sensory deficit. Gait normal.  Skin: Skin is warm and dry. No rash noted. No cyanosis. Nails show no clubbing.  Psychiatric: He has a normal mood and affect. His speech is normal and behavior is normal. Judgment and thought content normal. Cognition and memory are normal.   He did not go for the ordered labs before this visit.  Assessment & Plan:   1. Uncontrolled type 2 diabetes mellitus with complication, with long-term current use of insulin (HCC)   - Patient has  currently uncontrolled symptomatic type 2 DM since  71 years of age. He came with fluctuating but still above target BG levels. He says he now has Lantus and Novolog filled and wants to know how to use them. His recent A1c is 11.6% slightly better than 12.2%.  -He does not have recent labs to review his renal function. - He has lab orders in place to do sometime this week.  His diabetes is complicated by CVA, noncompliance, obesity, unaffordable medications and patient remains at a high risk for more acute and chronic complications of diabetes which include CAD, CVA, CKD, retinopathy, and neuropathy. These are all discussed in detail with the patient.  - I have counseled the patient on diet management and weight  loss, by adopting a carbohydrate restricted/protein rich diet.  - Suggestion is made for patient to avoid simple carbohydrates   from their diet including Cakes , Desserts, Ice Cream,  Soda (  diet and regular) , Sweet Tea , Candies,  Chips, Cookies, Artificial Sweeteners,   and "Sugar-free" Products . This will help patient to have stable blood glucose profile and potentially avoid unintended weight gain.  - I encouraged the patient to switch to  unprocessed or minimally processed complex starch and increased protein intake (animal or plant source), fruits, and vegetables.  - Patient is advised to stick to a routine mealtimes to eat 3 meals  a day and avoid unnecessary snacks ( to snack only to correct hypoglycemia).  - The patient will be scheduled with Norm Salt, RDN, CDE for individualized DM education.  - I have approached patient with the following individualized plan to manage diabetes and patient agrees:    - He came with still significantly above target and significantly fluctuating blood glucose profile. This is mainly due to his dietary indiscretion.  - He now states that he has field the Lantus and NovoLog and he wants to know how to use them. -I advised him to continue monitoring blood glucose strictly at before meals and at bedtime-4 times a day. - I will initiate Lantus 80 units daily at bedtime, NovoLog 15 units 3 times a day before meals for pre-meal blood glucose above 90 mg/dL. He is also given individualized correction dose from NovoLog for blood glucose readings above 150 mg/dL.  - I had a long discussion with him to maximize his caloric/carb restriction and I  I have approached him to continue with  premixed insulin  Novolin 70/30  80 units with breakfast and 80 units with supper  for pre-meal blood glucose above 90 mg/dL.   - He is advised to continue strict monitoring of blood glucose before meals and at bedtime.   -Patient is encouraged to call clinic for blood  glucose levels less than 70 or above 300 mg /dl. - He mentions intolerance to metformin and other unidentified medications.   - Patient specific target  A1c;  LDL, HDL, Triglycerides, and  Waist Circumference were discussed in detail.  2) BP/HTN: Uncontrolled. Continue current medications including ACEI/ARB. 3) Lipids/HPL:  controlled LDL at 50 .  4)  Weight/Diet:  He is progressively gaining weight. CDE Consult  in progress  , exercise, and detailed carbohydrates information provided.  5) Chronic Care/Health Maintenance:  -Patient is on ACEI/ARB  medications and encouraged to continue to follow up with Ophthalmology, Podiatrist at least yearly or according to recommendations, and advised to  quit smoking. I have recommended yearly flu vaccine and pneumonia vaccination at least every 5 years; moderate  intensity exercise for up to 150 minutes weekly; and  sleep for at least 7 hours a day.  -25 minutes of time was spent on the care of this patient , 50% of which was applied for counseling on diabetes complications and their preventions.  - Patient to bring meter and  blood glucose logs during their next visit.   - I advised patient to maintain close follow up with Ignatius Specking, MD for primary care needs.  Follow up plan: - Return in about 4 weeks (around 01/08/2017) for follow up with meter and logs- no labs.  Marquis Lunch, MD Phone: [phone]  Fax: [phone]   12/11/2016, 10:43 AM

## 2016-12-31 ENCOUNTER — Telehealth: Payer: Self-pay | Admitting: "Endocrinology

## 2016-12-31 NOTE — Telephone Encounter (Signed)
Patient has questions about his night time insulin. Please call.

## 2016-12-31 NOTE — Telephone Encounter (Signed)
Pts phone # is incorrect. Awaiting return call from pt.

## 2017-01-08 ENCOUNTER — Ambulatory Visit: Payer: Medicare HMO | Admitting: "Endocrinology

## 2017-01-16 ENCOUNTER — Encounter: Payer: Self-pay | Admitting: "Endocrinology

## 2017-01-16 ENCOUNTER — Ambulatory Visit (INDEPENDENT_AMBULATORY_CARE_PROVIDER_SITE_OTHER): Payer: Medicare HMO | Admitting: "Endocrinology

## 2017-01-16 VITALS — BP 162/83 | HR 105 | Ht 67.5 in | Wt 307.0 lb

## 2017-01-16 DIAGNOSIS — E1165 Type 2 diabetes mellitus with hyperglycemia: Secondary | ICD-10-CM | POA: Diagnosis not present

## 2017-01-16 DIAGNOSIS — I10 Essential (primary) hypertension: Secondary | ICD-10-CM | POA: Diagnosis not present

## 2017-01-16 DIAGNOSIS — Z9119 Patient's noncompliance with other medical treatment and regimen: Secondary | ICD-10-CM

## 2017-01-16 DIAGNOSIS — E118 Type 2 diabetes mellitus with unspecified complications: Secondary | ICD-10-CM

## 2017-01-16 DIAGNOSIS — Z794 Long term (current) use of insulin: Secondary | ICD-10-CM

## 2017-01-16 DIAGNOSIS — Z91199 Patient's noncompliance with other medical treatment and regimen due to unspecified reason: Secondary | ICD-10-CM

## 2017-01-16 DIAGNOSIS — IMO0002 Reserved for concepts with insufficient information to code with codable children: Secondary | ICD-10-CM

## 2017-01-16 NOTE — Progress Notes (Signed)
Subjective:    Patient ID: Jeff Garcia, male    DOB: 1945/12/22. Patient is being seen in f/u for management of diabetes requested by  Ignatius Specking, MD  Past Medical History:  Diagnosis Date  . Asthma   . BPH (benign prostatic hyperplasia)   . COPD (chronic obstructive pulmonary disease) (HCC)   . Diabetes mellitus without complication (HCC)   . Hyperlipidemia   . Hypertension   . Neuropathy (HCC)   . Stroke [facility])    History reviewed. No pertinent surgical history. Social History   Social History  . Marital status: Single    Spouse name: N/A  . Number of children: N/A  . Years of education: N/A   Social History Main Topics  . Smoking status: Current Every Day Smoker  . Smokeless tobacco: Never Used  . Alcohol use 0.0 oz/week  . Drug use: Yes    Types: Cocaine  . Sexual activity: Not Asked   Other Topics Concern  . None   Social History Narrative  . None   Outpatient Encounter Prescriptions as of 01/16/2017  Medication Sig  . ALPRAZolam (XANAX) 0.5 MG tablet Take 0.5 mg by mouth at bedtime as needed for anxiety.  Marland Kitchen aspirin 81 MG tablet Take 81 mg by mouth daily.  . Cholecalciferol (VITAMIN D) 2000 units CAPS Take by mouth.  . furosemide (LASIX) 40 MG tablet Take 40 mg by mouth daily.  . Insulin Aspart (NOVOLOG FLEXPEN [place]) Inject 20-26 Units into the skin 3 (three) times daily with meals.  . Insulin Glargine (LANTUS SOLOSTAR [place]) Inject 80 Units into the skin at bedtime.  . INSULIN SYRINGE 1CC/29G 29G X 1/2" 1 ML MISC Use as directed twice a day to inject insulin.  Marland Kitchen lisinopril (PRINIVIL,ZESTRIL) 10 MG tablet Take 10 mg by mouth daily.  . Misc Natural Products (PROSTATE HEALTH PO) Take by mouth.  . potassium chloride (K-DUR) 10 MEQ tablet Take 10 mEq by mouth daily.  . tamsulosin (FLOMAX) 0.4 MG CAPS capsule Take 0.4 mg by mouth.  . vitamin E 1000 UNIT capsule Take 1,000 Units by mouth daily.   No facility-administered encounter medications on file as of  01/16/2017.    ALLERGIES: No Known Allergies VACCINATION STATUS:  There is no immunization history on file for this patient.  Diabetes  He presents for his follow-up diabetic visit. He has type 2 diabetes mellitus. Onset time: He was diagnosed at approximate age of 48 years. His disease course has been improving. There are no hypoglycemic associated symptoms. Pertinent negatives for hypoglycemia include no confusion, headaches, pallor or seizures. Associated symptoms include blurred vision, polydipsia and polyuria. Pertinent negatives for diabetes include no chest pain, no fatigue, no polyphagia and no weakness. There are no hypoglycemic complications. Symptoms are improving. Diabetic complications include a CVA. Risk factors for coronary artery disease include diabetes mellitus, dyslipidemia, family history, hypertension, male sex, obesity, sedentary lifestyle and tobacco exposure. Current diabetic treatment includes insulin injections (He changed around the doses of NovoLog 70/30 taking up to 120 units at times despite my advice for him to take 60 units twice a day with breakfast and supper.). He is compliant with treatment some of the time. His weight is stable. He is following a generally unhealthy diet. He has not had a previous visit with a dietitian. He never participates in exercise. His breakfast blood glucose range is generally 140-180 mg/dl. His lunch blood glucose range is generally 180-200 mg/dl. His dinner blood glucose range is generally  180-200 mg/dl. His overall blood glucose range is 180-200 mg/dl. An ACE inhibitor/angiotensin II receptor blocker is being taken.  Hypertension  This is a chronic problem. The current episode started more than 1 year ago. The problem is uncontrolled. Associated symptoms include blurred vision. Pertinent negatives include no chest pain, headaches, neck pain, palpitations or shortness of breath. Risk factors for coronary artery disease include dyslipidemia,  diabetes mellitus, obesity, male gender, family history, smoking/tobacco exposure and sedentary lifestyle. Past treatments include ACE inhibitors. Hypertensive end-organ damage includes CVA.   Review of Systems  Constitutional: Negative for chills, fatigue, fever and unexpected weight change.  HENT: Negative for dental problem, mouth sores and trouble swallowing.   Eyes: Positive for blurred vision. Negative for visual disturbance.  Respiratory: Negative for cough, choking, chest tightness, shortness of breath and wheezing.   Cardiovascular: Negative for chest pain, palpitations and leg swelling.  Gastrointestinal: Negative for abdominal distention, abdominal pain, constipation, diarrhea, nausea and vomiting.  Endocrine: Positive for polydipsia and polyuria. Negative for polyphagia.  Genitourinary: Negative for dysuria, flank pain, hematuria and urgency.  Musculoskeletal: Negative for back pain, gait problem, myalgias and neck pain.  Skin: Negative for pallor, rash and wound.  Neurological: Negative for seizures, syncope, weakness, numbness and headaches.  Psychiatric/Behavioral: Negative.  Negative for confusion and dysphoric mood.    Objective:    BP (!) 162/83   Pulse (!) 105   Ht 5' 7.5" (1.715 m)   Wt (!) 307 lb (139.3 kg)   BMI 47.37 kg/m   Wt Readings from Last 3 Encounters:  01/16/17 (!) 307 lb (139.3 kg)  12/11/16 (!) 309 lb (140.2 kg)  09/25/16 (!) 308 lb (139.7 kg)    Physical Exam  Constitutional: He is oriented to person, place, and time. He appears well-developed. He is cooperative. No distress.  Poor personal hygiene.  HENT:  Head: Normocephalic and atraumatic.  Eyes: EOM are normal.  Neck: Normal range of motion. Neck supple. No tracheal deviation present. No thyromegaly present.  Cardiovascular: Normal rate, S1 normal, S2 normal and normal heart sounds.  Exam reveals no gallop.   No murmur heard. Pulses:      Dorsalis pedis pulses are 1+ on the right side, and  1+ on the left side.       Posterior tibial pulses are 1+ on the right side, and 1+ on the left side.  Pulmonary/Chest: Breath sounds normal. No respiratory distress. He has no wheezes.  Abdominal: Soft. Bowel sounds are normal. He exhibits no distension. There is no tenderness. There is no guarding and no CVA tenderness.  Musculoskeletal: He exhibits no edema.       Right shoulder: He exhibits no swelling and no deformity.  Neurological: He is alert and oriented to person, place, and time. He has normal strength and normal reflexes. No cranial nerve deficit or sensory deficit. Gait normal.  Skin: Skin is warm and dry. No rash noted. No cyanosis. Nails show no clubbing.  Psychiatric: He has a normal mood and affect. His speech is normal and behavior is normal. Judgment and thought content normal. Cognition and memory are normal.   He did not go for the ordered labs before this visit.  Assessment & Plan:   1. Uncontrolled type 2 diabetes mellitus with complication, with long-term current use of insulin (HCC)   - Patient has currently uncontrolled symptomatic type 2 DM since  71 years of age. He came with fluctuating but still above target BG levels. He says he now  has Lantus and Novolog filled and wants to know how to use them. His recent A1c is 11.6% slightly better than 12.2%.  -He does not have recent labs to review his renal function. - He has lab orders in place to do sometime this week.  His diabetes is complicated by CVA, noncompliance, obesity, unaffordable medications and patient remains at a high risk for more acute and chronic complications of diabetes which include CAD, CVA, CKD, retinopathy, and neuropathy. These are all discussed in detail with the patient.  - I have counseled the patient on diet management and weight loss, by adopting a carbohydrate restricted/protein rich diet.  - Suggestion is made for patient to avoid simple carbohydrates   from their diet including Cakes  , Desserts, Ice Cream,  Soda (  diet and regular) , Sweet Tea , Candies,  Chips, Cookies, Artificial Sweeteners,   and "Sugar-free" Products . This will help patient to have stable blood glucose profile and potentially avoid unintended weight gain.  - I encouraged the patient to switch to  unprocessed or minimally processed complex starch and increased protein intake (animal or plant source), fruits, and vegetables.  - Patient is advised to stick to a routine mealtimes to eat 3 meals  a day and avoid unnecessary snacks ( to snack only to correct hypoglycemia).  - The patient will be scheduled with Norm Salt, RDN, CDE for individualized DM education.  - I have approached patient with the following individualized plan to manage diabetes and patient agrees:    - He came with still significantly above target and significantly fluctuating blood glucose profile. This is mainly due to his dietary indiscretion. -I advised him to continue monitoring blood glucose strictly at before meals and at bedtime-4 times a day. - I will continue Lantus at 80 units  daily at bedtime, increase NovoLog to 20 units 3 times a day before meals for pre-meal blood glucose above 90 mg/dL. He is also given individualized correction dose from NovoLog for blood glucose readings above 150 mg/dL.  - I had a long discussion with him to maximize his caloric/carb restriction . - He is advised to continue strict monitoring of blood glucose before meals and at bedtime.  -Patient is encouraged to call clinic for blood glucose levels less than 70 or above 300 mg /dl. - He mentions intolerance to metformin and other unidentified medications.   - Patient specific target  A1c;  LDL, HDL, Triglycerides, and  Waist Circumference were discussed in detail.  2) BP/HTN: Uncontrolled. Continue current medications including ACEI/ARB. 3) Lipids/HPL:  controlled LDL at 50 .  4)  Weight/Diet:  He is progressively gaining weight. CDE  Consult  in progress  , exercise, and detailed carbohydrates information provided.  5) Chronic Care/Health Maintenance:  -Patient is on ACEI/ARB  medications and encouraged to continue to follow up with Ophthalmology, Podiatrist at least yearly or according to recommendations, and advised to  quit smoking. I have recommended yearly flu vaccine and pneumonia vaccination at least every 5 years; moderate intensity exercise for up to 150 minutes weekly; and  sleep for at least 7 hours a day.  -25 minutes of time was spent on the care of this patient , 50% of which was applied for counseling on diabetes complications and their preventions.  - Patient to bring meter and  blood glucose logs during their next visit.   - I advised patient to maintain close follow up with Ignatius Specking, MD for primary care needs.  Follow up plan: - Return in about 6 weeks (around 02/27/2017) for meter, and logs.  Marquis Lunch, MD Phone: [phone]  Fax: [phone]   01/16/2017, 2:36 PM

## 2017-01-16 NOTE — Patient Instructions (Signed)

## 2017-02-27 ENCOUNTER — Ambulatory Visit: Payer: Medicare HMO | Admitting: "Endocrinology

## 2017-03-14 LAB — HEMOGLOBIN A1C: Hemoglobin A1C: 10

## 2017-03-26 ENCOUNTER — Ambulatory Visit: Payer: Medicare HMO | Admitting: "Endocrinology

## 2017-04-08 ENCOUNTER — Ambulatory Visit (INDEPENDENT_AMBULATORY_CARE_PROVIDER_SITE_OTHER): Payer: Medicare HMO | Admitting: "Endocrinology

## 2017-04-08 ENCOUNTER — Encounter: Payer: Self-pay | Admitting: "Endocrinology

## 2017-04-08 VITALS — BP 156/89 | HR 48 | Ht 67.5 in | Wt 313.0 lb

## 2017-04-08 DIAGNOSIS — I10 Essential (primary) hypertension: Secondary | ICD-10-CM

## 2017-04-08 DIAGNOSIS — Z794 Long term (current) use of insulin: Secondary | ICD-10-CM

## 2017-04-08 DIAGNOSIS — E1165 Type 2 diabetes mellitus with hyperglycemia: Secondary | ICD-10-CM

## 2017-04-08 DIAGNOSIS — Z91199 Patient's noncompliance with other medical treatment and regimen due to unspecified reason: Secondary | ICD-10-CM

## 2017-04-08 DIAGNOSIS — Z9119 Patient's noncompliance with other medical treatment and regimen: Secondary | ICD-10-CM

## 2017-04-08 DIAGNOSIS — IMO0002 Reserved for concepts with insufficient information to code with codable children: Secondary | ICD-10-CM

## 2017-04-08 DIAGNOSIS — E118 Type 2 diabetes mellitus with unspecified complications: Secondary | ICD-10-CM | POA: Diagnosis not present

## 2017-04-08 NOTE — Patient Instructions (Signed)

## 2017-04-08 NOTE — Progress Notes (Signed)
Subjective:    Patient ID: Jeff Garcia, male    DOB: [birth year]. Patient is being seen in f/u for management of diabetes requested by  Ignatius SpeckingVyas, Dhruv B, MD  Past Medical History:  Diagnosis Date  . Asthma   . BPH (benign prostatic hyperplasia)   . COPD (chronic obstructive pulmonary disease) (HCC)   . Diabetes mellitus without complication (HCC)   . Hyperlipidemia   . Hypertension   . Neuropathy   . Stroke [facility](HCC)    History reviewed. No pertinent surgical history. Social History   Social History  . Marital status: Single    Spouse name: N/A  . Number of children: N/A  . Years of education: N/A   Social History Main Topics  . Smoking status: Current Every Day Smoker  . Smokeless tobacco: Never Used  . Alcohol use 0.0 oz/week  . Drug use: Yes    Types: Cocaine  . Sexual activity: Not Asked   Other Topics Concern  . None   Social History Narrative  . None   Outpatient Encounter Prescriptions as of 04/08/2017  Medication Sig  . ALPRAZolam (XANAX) 0.5 MG tablet Take 0.5 mg by mouth at bedtime as needed for anxiety.  Marland Kitchen. aspirin 81 MG tablet Take 81 mg by mouth daily.  . Cholecalciferol (VITAMIN D) 2000 units CAPS Take by mouth.  . furosemide (LASIX) 40 MG tablet Take 40 mg by mouth daily.  . Insulin Aspart (NOVOLOG FLEXPEN [place]) Inject 20-26 Units into the skin 3 (three) times daily with meals.  . Insulin Glargine (LANTUS SOLOSTAR [place]) Inject 80 Units into the skin at bedtime.  . INSULIN SYRINGE 1CC/29G 29G X 1/2" 1 ML MISC Use as directed twice a day to inject insulin.  Marland Kitchen. lisinopril (PRINIVIL,ZESTRIL) 10 MG tablet Take 10 mg by mouth daily.  . Misc Natural Products (PROSTATE HEALTH PO) Take by mouth.  . potassium chloride (K-DUR) 10 MEQ tablet Take 10 mEq by mouth daily.  . tamsulosin (FLOMAX) 0.4 MG CAPS capsule Take 0.4 mg by mouth.  . vitamin E 1000 UNIT capsule Take 1,000 Units by mouth daily.   No facility-administered encounter medications on file as of 04/08/2017.     ALLERGIES: No Known Allergies VACCINATION STATUS:  There is no immunization history on file for this patient.  Diabetes  He presents for his follow-up diabetic visit. He has type 2 diabetes mellitus. Onset time: He was diagnosed at approximate age of 48 years. His disease course has been improving. There are no hypoglycemic associated symptoms. Pertinent negatives for hypoglycemia include no confusion, headaches, pallor or seizures. Associated symptoms include blurred vision, polydipsia and polyuria. Pertinent negatives for diabetes include no chest pain, no fatigue, no polyphagia and no weakness. There are no hypoglycemic complications. Symptoms are improving. Diabetic complications include a CVA. Risk factors for coronary artery disease include diabetes mellitus, dyslipidemia, family history, hypertension, male sex, obesity, sedentary lifestyle and tobacco exposure. Current diabetic treatment includes insulin injections (He changed around the doses of NovoLog 70/30 taking up to 120 units at times despite my advice for him to take 60 units twice a day with breakfast and supper.). He is compliant with treatment some of the time. His weight is stable. He is following a generally unhealthy diet. He has not had a previous visit with a dietitian. He never participates in exercise. His breakfast blood glucose range is generally 140-180 mg/dl. His lunch blood glucose range is generally 180-200 mg/dl. His dinner blood glucose range is generally 180-200  mg/dl. His overall blood glucose range is 180-200 mg/dl. An ACE inhibitor/angiotensin II receptor blocker is being taken.  Hypertension  This is a chronic problem. The current episode started more than 1 year ago. The problem is uncontrolled. Associated symptoms include blurred vision. Pertinent negatives include no chest pain, headaches, neck pain, palpitations or shortness of breath. Risk factors for coronary artery disease include dyslipidemia, diabetes  mellitus, obesity, male gender, family history, smoking/tobacco exposure and sedentary lifestyle. Past treatments include ACE inhibitors. Hypertensive end-organ damage includes CVA.   Review of Systems  Constitutional: Negative for chills, fatigue, fever and unexpected weight change.  HENT: Negative for dental problem, mouth sores and trouble swallowing.   Eyes: Positive for blurred vision. Negative for visual disturbance.  Respiratory: Negative for cough, choking, chest tightness, shortness of breath and wheezing.   Cardiovascular: Negative for chest pain, palpitations and leg swelling.  Gastrointestinal: Negative for abdominal distention, abdominal pain, constipation, diarrhea, nausea and vomiting.  Endocrine: Positive for polydipsia and polyuria. Negative for polyphagia.  Genitourinary: Negative for dysuria, flank pain, hematuria and urgency.  Musculoskeletal: Negative for back pain, gait problem, myalgias and neck pain.  Skin: Negative for pallor, rash and wound.  Neurological: Negative for seizures, syncope, weakness, numbness and headaches.  Psychiatric/Behavioral: Negative.  Negative for confusion and dysphoric mood.    Objective:    BP (!) 156/89   Pulse (!) 48   Ht 5' 7.5" (1.715 m)   Wt (!) 313 lb (142 kg)   BMI 48.30 kg/m   Wt Readings from Last 3 Encounters:  04/08/17 (!) 313 lb (142 kg)  01/16/17 (!) 307 lb (139.3 kg)  12/11/16 (!) 309 lb (140.2 kg)    Physical Exam  Constitutional: He is oriented to person, place, and time. He appears well-developed. He is cooperative. No distress.  Poor personal hygiene.  HENT:  Head: Normocephalic and atraumatic.  Eyes: EOM are normal.  Neck: Normal range of motion. Neck supple. No tracheal deviation present. No thyromegaly present.  Cardiovascular: Normal rate, S1 normal, S2 normal and normal heart sounds.  Exam reveals no gallop.   No murmur heard. Pulses:      Dorsalis pedis pulses are 1+ on the right side, and 1+ on the  left side.       Posterior tibial pulses are 1+ on the right side, and 1+ on the left side.  Pulmonary/Chest: Breath sounds normal. No respiratory distress. He has no wheezes.  Abdominal: Soft. Bowel sounds are normal. He exhibits no distension. There is no tenderness. There is no guarding and no CVA tenderness.  Musculoskeletal: He exhibits no edema.       Right shoulder: He exhibits no swelling and no deformity.  Neurological: He is alert and oriented to person, place, and time. He has normal strength and normal reflexes. No cranial nerve deficit or sensory deficit. Gait normal.  Skin: Skin is warm and dry. No rash noted. No cyanosis. Nails show no clubbing.  Psychiatric: He has a normal mood and affect. His speech is normal and behavior is normal. Judgment and thought content normal. Cognition and memory are normal.   He did not go for the ordered labs before this visit.  Assessment & Plan:   1. Uncontrolled type 2 diabetes mellitus with complication, with long-term current use of insulin (HCC)   - Patient has currently uncontrolled symptomatic type 2 DM since  71 years of age. He came with better ,  fluctuating but still above target BG levels. His recent  A1c is 10% slowly  Improving from 12.2%.  -He does not have recent labs to review his renal function. - He has lab orders in place to do sometime this week.  His diabetes is complicated by CVA, noncompliance, obesity, unaffordable medications and patient remains at a high risk for more acute and chronic complications of diabetes which include CAD, CVA, CKD, retinopathy, and neuropathy. These are all discussed in detail with the patient.  - I have counseled the patient on diet management and weight loss, by adopting a carbohydrate restricted/protein rich diet.  - Suggestion is made for patient to avoid simple carbohydrates   from their diet including Cakes , Desserts, Ice Cream,  Soda (  diet and regular) , Sweet Tea , Candies,  Chips,  Cookies, Artificial Sweeteners,   and "Sugar-free" Products . This will help patient to have stable blood glucose profile and potentially avoid unintended weight gain.  - I encouraged the patient to switch to  unprocessed or minimally processed complex starch and increased protein intake (animal or plant source), fruits, and vegetables.  - Patient is advised to stick to a routine mealtimes to eat 3 meals  a day and avoid unnecessary snacks ( to snack only to correct hypoglycemia).  - The patient will be scheduled with Norm Salt, RDN, CDE for individualized DM education.  - I have approached patient with the following individualized plan to manage diabetes and patient agrees:    - He came with slightly better blood glucose profile. - This is mainly due to his dietary indiscretion- follows random meal and insulin timing. -I advised him to continue monitoring blood glucose strictly at before meals and at bedtime-4 times a day. - I will continue Lantus at 80 units  daily at bedtime, continue NovoLog 20 units 3 times a day before meals for pre-meal blood glucose above 90 mg/dL. He is also given individualized correction dose from NovoLog for blood glucose readings above 150 mg/dL.  - I had a long discussion with him to maximize his caloric/carb restriction . - He is advised to continue strict monitoring of blood glucose before meals and at bedtime.  -Patient is encouraged to call clinic for blood glucose levels less than 70 or above 300 mg /dl. - He mentions intolerance to metformin and other unidentified medications.   - Patient specific target  A1c;  LDL, HDL, Triglycerides, and  Waist Circumference were discussed in detail.  2) BP/HTN: Uncontrolled. Continue current medications including ACEI/ARB. 3) Lipids/HPL:  controlled LDL at 50 .  4)  Weight/Diet:  He is progressively gaining weight. CDE Consult  in progress  , exercise, and detailed carbohydrates information provided.  5)  Chronic Care/Health Maintenance:  -Patient is on ACEI/ARB  medications and encouraged to continue to follow up with Ophthalmology, Podiatrist at least yearly or according to recommendations, and advised to  quit smoking. I have recommended yearly flu vaccine and pneumonia vaccination at least every 5 years; moderate intensity exercise for up to 150 minutes weekly; and  sleep for at least 7 hours a day.  -25 minutes of time was spent on the care of this patient , 50% of which was applied for counseling on diabetes complications and their preventions.  - Patient to bring meter and  blood glucose logs during their next visit.   - I advised patient to maintain close follow up with Ignatius Specking, MD for primary care needs.  Follow up plan: - No Follow-up on file.  Marquis Lunch, MD  Phone: [phone]  Fax: [phone]   04/08/2017, 3:38 PM

## 2017-06-28 LAB — HEMOGLOBIN A1C: Hemoglobin A1C: 10.3

## 2017-07-09 ENCOUNTER — Ambulatory Visit (INDEPENDENT_AMBULATORY_CARE_PROVIDER_SITE_OTHER): Payer: Medicare HMO | Admitting: "Endocrinology

## 2017-07-09 ENCOUNTER — Encounter: Payer: Self-pay | Admitting: "Endocrinology

## 2017-07-09 ENCOUNTER — Ambulatory Visit: Payer: Self-pay | Admitting: Nutrition

## 2017-07-09 ENCOUNTER — Telehealth: Payer: Self-pay | Admitting: Nutrition

## 2017-07-09 VITALS — BP 153/81 | HR 97 | Ht 67.5 in | Wt 308.0 lb

## 2017-07-09 DIAGNOSIS — E118 Type 2 diabetes mellitus with unspecified complications: Secondary | ICD-10-CM

## 2017-07-09 DIAGNOSIS — Z9119 Patient's noncompliance with other medical treatment and regimen: Secondary | ICD-10-CM | POA: Diagnosis not present

## 2017-07-09 DIAGNOSIS — I10 Essential (primary) hypertension: Secondary | ICD-10-CM

## 2017-07-09 DIAGNOSIS — E1165 Type 2 diabetes mellitus with hyperglycemia: Secondary | ICD-10-CM

## 2017-07-09 DIAGNOSIS — Z91199 Patient's noncompliance with other medical treatment and regimen due to unspecified reason: Secondary | ICD-10-CM

## 2017-07-09 DIAGNOSIS — Z794 Long term (current) use of insulin: Secondary | ICD-10-CM | POA: Diagnosis not present

## 2017-07-09 DIAGNOSIS — IMO0002 Reserved for concepts with insufficient information to code with codable children: Secondary | ICD-10-CM

## 2017-07-09 NOTE — Patient Instructions (Signed)

## 2017-07-09 NOTE — Progress Notes (Signed)
Subjective:    Patient ID: Jeff Garcia, male    DOB: [birth year]. Patient is being seen in f/u for management of diabetes requested by  Ignatius SpeckingVyas, Dhruv B, MD  Past Medical History:  Diagnosis Date  . Asthma   . BPH (benign prostatic hyperplasia)   . COPD (chronic obstructive pulmonary disease) (HCC)   . Diabetes mellitus without complication (HCC)   . Hyperlipidemia   . Hypertension   . Neuropathy   . Stroke [facility](HCC)    No past surgical history on file. Social History   Social History  . Marital status: Single    Spouse name: N/A  . Number of children: N/A  . Years of education: N/A   Social History Main Topics  . Smoking status: Current Every Day Smoker  . Smokeless tobacco: Never Used  . Alcohol use 0.0 oz/week  . Drug use: Yes    Types: Cocaine  . Sexual activity: Not Asked   Other Topics Concern  . None   Social History Narrative  . None   Outpatient Encounter Prescriptions as of 07/09/2017  Medication Sig  . Insulin Aspart (NOVOLOG FLEXPEN [place]) Inject 20-26 Units into the skin 3 (three) times daily with meals.  . Insulin Glargine (LANTUS SOLOSTAR [place]) Inject 70 Units into the skin at bedtime.  . ALPRAZolam (XANAX) 0.5 MG tablet Take 0.5 mg by mouth at bedtime as needed for anxiety.  Marland Kitchen. aspirin 81 MG tablet Take 81 mg by mouth daily.  . Cholecalciferol (VITAMIN D) 2000 units CAPS Take by mouth.  . furosemide (LASIX) 40 MG tablet Take 40 mg by mouth daily.  Marland Kitchen. lisinopril (PRINIVIL,ZESTRIL) 10 MG tablet Take 10 mg by mouth daily.  . Misc Natural Products (PROSTATE HEALTH PO) Take by mouth.  . potassium chloride (K-DUR) 10 MEQ tablet Take 10 mEq by mouth daily.  . tamsulosin (FLOMAX) 0.4 MG CAPS capsule Take 0.4 mg by mouth.  . vitamin E 1000 UNIT capsule Take 1,000 Units by mouth daily.  . [DISCONTINUED] INSULIN SYRINGE 1CC/29G 29G X 1/2" 1 ML MISC Use as directed twice a day to inject insulin. (Patient not taking: Reported on 07/09/2017)   No facility-administered  encounter medications on file as of 07/09/2017.    ALLERGIES: No Known Allergies VACCINATION STATUS:  There is no immunization history on file for this patient.  Diabetes  He presents for his follow-up diabetic visit. He has type 2 diabetes mellitus. Onset time: He was diagnosed at approximate age of 48 years. His disease course has been stable. There are no hypoglycemic associated symptoms. Pertinent negatives for hypoglycemia include no confusion, headaches, pallor or seizures. Associated symptoms include blurred vision, polydipsia and polyuria. Pertinent negatives for diabetes include no chest pain, no fatigue, no polyphagia and no weakness. There are no hypoglycemic complications. Symptoms are stable. Diabetic complications include a CVA. Risk factors for coronary artery disease include diabetes mellitus, dyslipidemia, family history, hypertension, male sex, obesity, sedentary lifestyle and tobacco exposure. Current diabetic treatment includes insulin injections (He changed around the doses of NovoLog 70/30 taking up to 120 units at times despite my advice for him to take 60 units twice a day with breakfast and supper.). He is compliant with treatment some of the time. His weight is fluctuating dramatically. He is following a generally unhealthy diet. He has not had a previous visit with a dietitian. He never participates in exercise. His breakfast blood glucose range is generally 130-140 mg/dl. His lunch blood glucose range is generally 180-200 mg/dl.  His dinner blood glucose range is generally >200 mg/dl. His bedtime blood glucose range is generally >200 mg/dl. His overall blood glucose range is >200 mg/dl. An ACE inhibitor/angiotensin II receptor blocker is being taken.  Hypertension  This is a chronic problem. The current episode started more than 1 year ago. The problem is uncontrolled. Associated symptoms include blurred vision. Pertinent negatives include no chest pain, headaches, neck pain,  palpitations or shortness of breath. Risk factors for coronary artery disease include dyslipidemia, diabetes mellitus, obesity, male gender, family history, smoking/tobacco exposure and sedentary lifestyle. Past treatments include ACE inhibitors. Hypertensive end-organ damage includes CVA.   Review of Systems  Constitutional: Negative for chills, fatigue, fever and unexpected weight change.  HENT: Negative for dental problem, mouth sores and trouble swallowing.   Eyes: Positive for blurred vision. Negative for visual disturbance.  Respiratory: Negative for cough, choking, chest tightness, shortness of breath and wheezing.   Cardiovascular: Negative for chest pain, palpitations and leg swelling.  Gastrointestinal: Negative for abdominal distention, abdominal pain, constipation, diarrhea, nausea and vomiting.  Endocrine: Positive for polydipsia and polyuria. Negative for polyphagia.  Genitourinary: Negative for dysuria, flank pain, hematuria and urgency.  Musculoskeletal: Negative for back pain, gait problem, myalgias and neck pain.  Skin: Negative for pallor, rash and wound.  Neurological: Negative for seizures, syncope, weakness, numbness and headaches.  Psychiatric/Behavioral: Negative.  Negative for confusion and dysphoric mood.    Objective:    BP (!) 153/81   Pulse 97   Ht 5' 7.5" (1.715 m)   Wt (!) 308 lb (139.7 kg)   BMI 47.53 kg/m   Wt Readings from Last 3 Encounters:  07/09/17 (!) 308 lb (139.7 kg)  04/08/17 (!) 313 lb (142 kg)  01/16/17 (!) 307 lb (139.3 kg)    Physical Exam  Constitutional: He is oriented to person, place, and time. He appears well-developed. He is cooperative. No distress.  Poor personal hygiene.  HENT:  Head: Normocephalic and atraumatic.  Eyes: EOM are normal.  Neck: Normal range of motion. Neck supple. No tracheal deviation present. No thyromegaly present.  Cardiovascular: Normal rate, S1 normal, S2 normal and normal heart sounds.  Exam reveals no  gallop.   No murmur heard. Pulses:      Dorsalis pedis pulses are 1+ on the right side, and 1+ on the left side.       Posterior tibial pulses are 1+ on the right side, and 1+ on the left side.  Pulmonary/Chest: Breath sounds normal. No respiratory distress. He has no wheezes.  Abdominal: Soft. Bowel sounds are normal. He exhibits no distension. There is no tenderness. There is no guarding and no CVA tenderness.  Musculoskeletal: He exhibits no edema.       Right shoulder: He exhibits no swelling and no deformity.  Neurological: He is alert and oriented to person, place, and time. He has normal strength and normal reflexes. No cranial nerve deficit or sensory deficit. Gait normal.  Skin: Skin is warm and dry. No rash noted. No cyanosis. Nails show no clubbing.  Psychiatric: He has a normal mood and affect. His speech is normal and behavior is normal. Judgment and thought content normal. Cognition and memory are normal.   He did not go for the ordered labs before this visit.  Assessment & Plan:   1. Uncontrolled type 2 diabetes mellitus with complication, with long-term current use of insulin (HCC)   - Patient has currently uncontrolled symptomatic type 2 DM since  71 years of  age. He came with still  Fluctuating, Above target BG levels. His recent A1c is 10.3% unchanged from last visit, slowly  Improving from 12.2%.  -He does not have recent labs to review his renal function.   His diabetes is complicated by CVA, noncompliance, obesity, unaffordable medications and patient remains at a high risk for more acute and chronic complications of diabetes which include CAD, CVA, CKD, retinopathy, and neuropathy. These are all discussed in detail with the patient.  - I have counseled the patient on diet management and weight loss, by adopting a carbohydrate restricted/protein rich diet.  - Suggestion is made for patient to avoid simple carbohydrates   from his diet including Cakes ,  Sweet  Desserts, Ice Cream,  Soda (  diet and regular) , Sweet Tea , Candies, store bought  juices,  Chips, Cookies, Artificial Sweeteners,   and "Sugar-free" Products . This will help patient to have stable blood glucose profile and potentially avoid unintended weight gain.  - I encouraged the patient to switch to  unprocessed or minimally processed complex starch and increased protein intake (animal or plant source), fruits, and vegetables.  - Patient is advised to stick to a routine mealtimes to eat 3 meals  a day and avoid unnecessary snacks ( to snack only to correct hypoglycemia).   - I have approached patient with the following individualized plan to manage diabetes and patient agrees:   - He is struggling to control diabetes, this is mainly due to his dietary indiscretion- follows random meal and insulin timing. -I advised him to continue monitoring blood glucose strictly at before meals and at bedtime-4 times a day. - I will  lower her Lantus to 70 units  daily at bedtime, continue NovoLog 20 units 3 times a day before meals for pre-meal blood glucose above 90 mg/dL. He is also given individualized correction dose from NovoLog for blood glucose readings above 150 mg/dL.  - I had a long discussion with him to maximize his caloric/carb restriction . - He is advised to continue strict monitoring of blood glucose before meals and at bedtime.  -Patient is encouraged to call clinic for blood glucose levels less than 70 or above 300 mg /dl. - He mentions intolerance to metformin and other unidentified medications.  - Patient specific target  A1c;  LDL, HDL, Triglycerides, and  Waist Circumference were discussed in detail.  2) BP/HTN: Uncontrolled. He missed his blood pressure medications this morning.  Continue current medications including ACEI/ARB. 3) Lipids/HPL:  controlled LDL at 50 . he is not on statins.  4)  Weight/Diet:  has  fluctuating body weight, CDE Consult  in progress  , exercise,  and detailed carbohydrates information provided.  5) Chronic Care/Health Maintenance:  -Patient is on ACEI/ARB  medications and encouraged to continue to follow up with Ophthalmology, Podiatrist at least yearly or according to recommendations, and advised to  quit smoking. I have recommended yearly flu vaccine and pneumonia vaccination at least every 5 years; and  sleep for at least 7 hours a day. He cannot tolerate any significant amount of exercise.   - Time spent with the patient: 25 min, of which >50% was spent in reviewing his sugar logs , discussing his hypo- and hyper-glycemic episodes, reviewing  previous labs and insulin doses and developing a plan to avoid hypo- and hyper-glycemia.   - Patient to bring meter and  blood glucose logs during his next visit.  - I advised patient to maintain close follow  up with Ignatius Specking, MD for primary care needs.  Follow up plan: - Return in about 3 months (around 10/09/2017) for follow up with pre-visit labs, meter, and logs.  Marquis Lunch, MD Phone: [phone]  Fax: [phone]   07/09/2017, 11:06 AM

## 2017-07-09 NOTE — Telephone Encounter (Signed)
Jeff Garcia notes Jeff Garcia doesn't want to schedule an appt before coming back to see Dr. Fransico HimNida since Jeff Garcia lives in [place] and can't get around well. Will schedule appt at next visit with DR.  Fransico HimNida.

## 2017-07-22 ENCOUNTER — Telehealth: Payer: Self-pay

## 2017-07-22 NOTE — Telephone Encounter (Signed)
Increase Lantus to 80 units daily at bedtime, increase NovoLog to 25 units 3 times a day before meals plus correction.

## 2017-07-22 NOTE — Telephone Encounter (Signed)
Pt states he has had high BG readings.   Date Before breakfast Before lunch Before supper Bedtime  7/24 [phone]  7/25 [phone]  7/26 [phone]  7/27 319       Pt taking: Novolog 20-26 TIDAC, Lantus 70units qhs

## 2017-07-23 NOTE — Telephone Encounter (Signed)
PT notified

## 2017-10-03 ENCOUNTER — Encounter: Payer: Self-pay | Admitting: "Endocrinology

## 2017-10-03 LAB — HEMOGLOBIN A1C: Hemoglobin A1C: 9.8

## 2017-10-03 LAB — BASIC METABOLIC PANEL
BUN: 20 (ref 4–21)
Creatinine: 1 (ref <=1.3)

## 2017-10-16 ENCOUNTER — Ambulatory Visit: Payer: Medicare HMO | Admitting: "Endocrinology

## 2017-10-25 ENCOUNTER — Encounter: Payer: Self-pay | Admitting: "Endocrinology

## 2017-10-25 ENCOUNTER — Ambulatory Visit (INDEPENDENT_AMBULATORY_CARE_PROVIDER_SITE_OTHER): Payer: Medicare HMO | Admitting: "Endocrinology

## 2017-10-25 VITALS — BP 153/78 | HR 108 | Ht 67.5 in | Wt 314.4 lb

## 2017-10-25 DIAGNOSIS — E118 Type 2 diabetes mellitus with unspecified complications: Secondary | ICD-10-CM

## 2017-10-25 DIAGNOSIS — Z794 Long term (current) use of insulin: Secondary | ICD-10-CM | POA: Diagnosis not present

## 2017-10-25 DIAGNOSIS — I10 Essential (primary) hypertension: Secondary | ICD-10-CM | POA: Diagnosis not present

## 2017-10-25 DIAGNOSIS — IMO0002 Reserved for concepts with insufficient information to code with codable children: Secondary | ICD-10-CM

## 2017-10-25 DIAGNOSIS — E1165 Type 2 diabetes mellitus with hyperglycemia: Secondary | ICD-10-CM | POA: Diagnosis not present

## 2017-10-25 MED ORDER — METFORMIN HCL 500 MG PO TABS: 500.0000 mg | ORAL_TABLET | Freq: Two times a day (BID) | ORAL | 2 refills | Status: AC

## 2017-10-25 NOTE — Patient Instructions (Signed)

## 2017-10-25 NOTE — Progress Notes (Signed)
Subjective:    Patient ID: Jeff Garcia, male    DOB: 1946/04/10. Patient is being seen in f/u for management of diabetes requested by  Ignatius SpeckingVyas, Dhruv B, MD  Past Medical History:  Diagnosis Date  . Asthma   . BPH (benign prostatic hyperplasia)   . COPD (chronic obstructive pulmonary disease) (HCC)   . Diabetes mellitus without complication (HCC)   . Hyperlipidemia   . Hypertension   . Neuropathy   . Stroke [facility](HCC)    No past surgical history on file. Social History   Socioeconomic History  . Marital status: Single    Spouse name: None  . Number of children: None  . Years of education: None  . Highest education level: None  Social Needs  . Financial resource strain: None  . Food insecurity - worry: None  . Food insecurity - inability: None  . Transportation needs - medical: None  . Transportation needs - non-medical: None  Occupational History  . None  Tobacco Use  . Smoking status: Current Every Day Smoker  . Smokeless tobacco: Never Used  Substance and Sexual Activity  . Alcohol use: Yes    Alcohol/week: 0.0 oz  . Drug use: Yes    Types: Cocaine  . Sexual activity: None  Other Topics Concern  . None  Social History Narrative  . None   Outpatient Encounter Medications as of 10/25/2017  Medication Sig  . ALPRAZolam (XANAX) 0.5 MG tablet Take 0.5 mg by mouth at bedtime as needed for anxiety.  Marland Kitchen. aspirin 81 MG tablet Take 81 mg by mouth daily.  . Cholecalciferol (VITAMIN D) 2000 units CAPS Take by mouth.  . furosemide (LASIX) 40 MG tablet Take 40 mg by mouth daily.  . Insulin Aspart (NOVOLOG FLEXPEN [place]) Inject 25-31 Units into the skin 3 (three) times daily with meals.  . Insulin Glargine (LANTUS SOLOSTAR [place]) Inject 80 Units into the skin at bedtime.  Marland Kitchen. lisinopril (PRINIVIL,ZESTRIL) 10 MG tablet Take 10 mg by mouth daily.  . Misc Natural Products (PROSTATE HEALTH PO) Take by mouth.  . potassium chloride (K-DUR) 10 MEQ tablet Take 10 mEq by mouth daily.  .  tamsulosin (FLOMAX) 0.4 MG CAPS capsule Take 0.4 mg by mouth.  . vitamin E 1000 UNIT capsule Take 1,000 Units by mouth daily.  . metFORMIN (GLUCOPHAGE) 500 MG tablet Take 1 tablet (500 mg total) by mouth 2 (two) times daily with a meal.   No facility-administered encounter medications on file as of 10/25/2017.    ALLERGIES: No Known Allergies VACCINATION STATUS:  There is no immunization history on file for this patient.  Diabetes  He presents for his follow-up diabetic visit. He has type 2 diabetes mellitus. Onset time: He was diagnosed at approximate age of 48 years. His disease course has been stable. There are no hypoglycemic associated symptoms. Pertinent negatives for hypoglycemia include no confusion, headaches, pallor or seizures. Associated symptoms include blurred vision, polydipsia and polyuria. Pertinent negatives for diabetes include no chest pain, no fatigue, no polyphagia and no weakness. There are no hypoglycemic complications. Symptoms are stable. Diabetic complications include a CVA. Risk factors for coronary artery disease include diabetes mellitus, dyslipidemia, family history, hypertension, male sex, obesity, sedentary lifestyle and tobacco exposure. Current diabetic treatment includes insulin injections (He changed around the doses of NovoLog 70/30 taking up to 120 units at times despite my advice for him to take 60 units twice a day with breakfast and supper.). He is compliant with treatment some  of the time. His weight is fluctuating dramatically. He is following a generally unhealthy diet. He has not had a previous visit with a dietitian. He never participates in exercise. His breakfast blood glucose range is generally 130-140 mg/dl. His lunch blood glucose range is generally 180-200 mg/dl. His dinner blood glucose range is generally >200 mg/dl. His bedtime blood glucose range is generally >200 mg/dl. His overall blood glucose range is >200 mg/dl. An ACE inhibitor/angiotensin II  receptor blocker is being taken.  Hypertension  This is a chronic problem. The current episode started more than 1 year ago. The problem is uncontrolled. Associated symptoms include blurred vision. Pertinent negatives include no chest pain, headaches, neck pain, palpitations or shortness of breath. Risk factors for coronary artery disease include dyslipidemia, diabetes mellitus, obesity, male gender, family history, smoking/tobacco exposure and sedentary lifestyle. Past treatments include ACE inhibitors. Hypertensive end-organ damage includes CVA.   Review of Systems  Constitutional: Negative for chills, fatigue, fever and unexpected weight change.  HENT: Negative for dental problem, mouth sores and trouble swallowing.   Eyes: Positive for blurred vision. Negative for visual disturbance.  Respiratory: Negative for cough, choking, chest tightness, shortness of breath and wheezing.   Cardiovascular: Negative for chest pain, palpitations and leg swelling.  Gastrointestinal: Negative for abdominal distention, abdominal pain, constipation, diarrhea, nausea and vomiting.  Endocrine: Positive for polydipsia and polyuria. Negative for polyphagia.  Genitourinary: Negative for dysuria, flank pain, hematuria and urgency.  Musculoskeletal: Negative for back pain, gait problem, myalgias and neck pain.  Skin: Negative for pallor, rash and wound.  Neurological: Negative for seizures, syncope, weakness, numbness and headaches.  Psychiatric/Behavioral: Negative.  Negative for confusion and dysphoric mood.    Objective:    BP (!) 153/78   Pulse (!) 108   Ht 5' 7.5" (1.715 m)   Wt (!) 314 lb 6.4 oz (142.6 kg)   BMI 48.52 kg/m   Wt Readings from Last 3 Encounters:  10/25/17 (!) 314 lb 6.4 oz (142.6 kg)  07/09/17 (!) 308 lb (139.7 kg)  04/08/17 (!) 313 lb (142 kg)    Physical Exam  Constitutional: He is oriented to person, place, and time. He appears well-developed. He is cooperative. No distress.  Poor  personal hygiene.  HENT:  Head: Normocephalic and atraumatic.  Eyes: EOM are normal.  Neck: Normal range of motion. Neck supple. No tracheal deviation present. No thyromegaly present.  Cardiovascular: Normal rate, S1 normal, S2 normal and normal heart sounds. Exam reveals no gallop.  No murmur heard. Pulses:      Dorsalis pedis pulses are 1+ on the right side, and 1+ on the left side.       Posterior tibial pulses are 1+ on the right side, and 1+ on the left side.  Pulmonary/Chest: Breath sounds normal. No respiratory distress. He has no wheezes.  Abdominal: Soft. Bowel sounds are normal. He exhibits no distension. There is no tenderness. There is no guarding and no CVA tenderness.  Musculoskeletal: He exhibits no edema.       Right shoulder: He exhibits no swelling and no deformity.  Neurological: He is alert and oriented to person, place, and time. He has normal strength and normal reflexes. No cranial nerve deficit or sensory deficit. Gait normal.  Skin: Skin is warm and dry. No rash noted. No cyanosis. Nails show no clubbing.  Psychiatric: He has a normal mood and affect. His speech is normal and behavior is normal. Judgment and thought content normal. Cognition and memory are  normal.   Recent Results (from the past 2160 hour(s))  Basic metabolic panel     Status: None   Collection Time: 10/03/17 12:00 AM  Result Value Ref Range   BUN 20 4 - 21   Creatinine 1.0 0.6 - 1.3  Hemoglobin A1c     Status: None   Collection Time: 10/03/17 12:00 AM  Result Value Ref Range   Hemoglobin A1C 9.8      Assessment & Plan:   1. Uncontrolled type 2 diabetes mellitus with complication, with long-term current use of insulin (HCC)   - Patient has currently uncontrolled symptomatic type 2 DM since  71 years of age. He came with still significantly above target blood glucose profile. - His recent labs show A1c of 9.8%, progressively improving from 12.2%. - His recent labs are reviewed showing  normal renal function.   His diabetes is complicated by CVA, noncompliance, obesity, unaffordable medications and patient remains at a high risk for more acute and chronic complications of diabetes which include CAD, CVA, CKD, retinopathy, and neuropathy. These are all discussed in detail with the patient.  - I have counseled the patient on diet management and weight loss, by adopting a carbohydrate restricted/protein rich diet.  -  Suggestion is made for him to avoid simple carbohydrates  from his diet including Cakes, Sweet Desserts / Pastries, Ice Cream, Soda (diet and regular), Sweet Tea, Candies, Chips, Cookies, Store Bought Juices, Alcohol in Excess of  1-2 drinks a day, Artificial Sweeteners, and "Sugar-free" Products. This will help patient to have stable blood glucose profile and potentially avoid unintended weight gain.  - I encouraged the patient to switch to  unprocessed or minimally processed complex starch and increased protein intake (animal or plant source), fruits, and vegetables.  - Patient is advised to stick to a routine mealtimes to eat 3 meals  a day and avoid unnecessary snacks ( to snack only to correct hypoglycemia).   - I have approached patient with the following individualized plan to manage diabetes and patient agrees:   - He is struggling to control diabetes, this is mainly due to his dietary indiscretion- follows random meal and insulin timing. -I advised him to continue monitoring blood glucose strictly at before meals and at bedtime-4 times a day. - I will  continue Lantus at 8 units  daily at bedtime, continue NovoLog 25 units 3 times a day before meals for pre-meal blood glucose above 90 mg/dL. He is also given individualized correction dose from NovoLog for blood glucose readings above 150 mg/dL.  - I had a long discussion with him to maximize his caloric/carb restriction .  -Patient is encouraged to call clinic for blood glucose levels less than 70 or above  300 mg /dl. - He mentions intolerance to metformin in the past, however his willing to retry.  - I discussed and initiated low-dose metformin 500 mg by mouth twice a day after breakfast and supper.  - Patient specific target  A1c;  LDL, HDL, Triglycerides, and  Waist Circumference were discussed in detail.  2) BP/HTN: Uncontrolled.   Continue current medications including ACEI/ARB. 3) Lipids/HPL:  controlled LDL at 50 . he is not on statins.  4)  Weight/Diet:  has  fluctuating body weight, CDE Consult  in progress  , exercise, and detailed carbohydrates information provided.  5) Chronic Care/Health Maintenance:  -Patient is on ACEI/ARB  medications and encouraged to continue to follow up with Ophthalmology, Podiatrist at least yearly or according to  recommendations, and advised to  quit smoking. I have recommended yearly flu vaccine and pneumonia vaccination at least every 5 years; and  sleep for at least 7 hours a day. He cannot tolerate any significant amount of exercise.    - I advised patient to maintain close follow up with Ignatius Specking, MD for primary care needs. - Time spent with the patient: 25 min, of which >50% was spent in reviewing his sugar logs , discussing his hypo- and hyper-glycemic episodes, reviewing his current and  previous labs and insulin doses and developing a plan to avoid hypo- and hyper-glycemia.   Follow up plan: - Return in about 3 months (around 01/23/2018) for follow up with pre-visit labs, meter, and logs.  Marquis Lunch, MD Phone: [phone]  Fax: [phone]  -  This note was partially dictated with voice recognition software. Similar sounding words can be transcribed inadequately or may not  be corrected upon review.  10/25/2017, 12:53 PM

## 2017-11-12 ENCOUNTER — Ambulatory Visit: Payer: Medicare HMO | Admitting: "Endocrinology

## 2018-01-16 LAB — LIPID PANEL
Cholesterol: 115 (ref 0–200)
HDL: 45 (ref 35–70)
LDL CALC: 58
Triglycerides: 61 (ref 40–160)

## 2018-01-16 LAB — BASIC METABOLIC PANEL
BUN: 15 (ref 4–21)
Creatinine: 1.1 (ref <=1.3)

## 2018-01-16 LAB — VITAMIN D 25 HYDROXY (VIT D DEFICIENCY, FRACTURES): VIT D 25 HYDROXY: 13.4

## 2018-01-16 LAB — HEMOGLOBIN A1C: HEMOGLOBIN A1C: 10.5

## 2018-01-27 ENCOUNTER — Encounter: Payer: Self-pay | Admitting: "Endocrinology

## 2018-01-27 ENCOUNTER — Ambulatory Visit (INDEPENDENT_AMBULATORY_CARE_PROVIDER_SITE_OTHER): Payer: Medicare HMO | Admitting: "Endocrinology

## 2018-01-27 VITALS — BP 161/96 | HR 93 | Ht 67.5 in | Wt 317.0 lb

## 2018-01-27 DIAGNOSIS — E1165 Type 2 diabetes mellitus with hyperglycemia: Secondary | ICD-10-CM

## 2018-01-27 DIAGNOSIS — E559 Vitamin D deficiency, unspecified: Secondary | ICD-10-CM

## 2018-01-27 DIAGNOSIS — F17200 Nicotine dependence, unspecified, uncomplicated: Secondary | ICD-10-CM

## 2018-01-27 DIAGNOSIS — Z794 Long term (current) use of insulin: Secondary | ICD-10-CM | POA: Diagnosis not present

## 2018-01-27 DIAGNOSIS — I10 Essential (primary) hypertension: Secondary | ICD-10-CM

## 2018-01-27 DIAGNOSIS — IMO0002 Reserved for concepts with insufficient information to code with codable children: Secondary | ICD-10-CM

## 2018-01-27 DIAGNOSIS — E118 Type 2 diabetes mellitus with unspecified complications: Secondary | ICD-10-CM | POA: Diagnosis not present

## 2018-01-27 MED ORDER — FREESTYLE LIBRE 14 DAY SENSOR MISC: 1.0000 | 2 refills | Status: DC

## 2018-01-27 MED ORDER — VITAMIN D3 125 MCG (5000 UT) PO CAPS: 5000.0000 [IU] | ORAL_CAPSULE | Freq: Every day | ORAL | 0 refills | Status: DC

## 2018-01-27 MED ORDER — FREESTYLE LIBRE 14 DAY READER DEVI: 1.0000 | Freq: Once | 0 refills | Status: AC

## 2018-01-27 NOTE — Progress Notes (Signed)
Subjective:    Patient ID: Jeff Garcia, male    DOB: November 15, 1946. Patient is being seen in follow-up for management of diabetes requested by  Gennette PacVaughn, Jason C, NP  Past Medical History:  Diagnosis Date  . Asthma   . BPH (benign prostatic hyperplasia)   . COPD (chronic obstructive pulmonary disease) (HCC)   . Diabetes mellitus without complication (HCC)   . Hyperlipidemia   . Hypertension   . Neuropathy   . Stroke [facility](HCC)    History reviewed. No pertinent surgical history. Social History   Socioeconomic History  . Marital status: Single    Spouse name: None  . Number of children: None  . Years of education: None  . Highest education level: None  Social Needs  . Financial resource strain: None  . Food insecurity - worry: None  . Food insecurity - inability: None  . Transportation needs - medical: None  . Transportation needs - non-medical: None  Occupational History  . None  Tobacco Use  . Smoking status: Current Every Day Smoker  . Smokeless tobacco: Never Used  Substance and Sexual Activity  . Alcohol use: Yes    Alcohol/week: 0.0 oz  . Drug use: Yes    Types: Cocaine  . Sexual activity: None  Other Topics Concern  . None  Social History Narrative  . None   Outpatient Encounter Medications as of 01/27/2018  Medication Sig  . furosemide (LASIX) 40 MG tablet Take 40 mg by mouth daily.  Marland Kitchen. glipiZIDE (GLUCOTROL) 10 MG tablet Take 10 mg by mouth 2 (two) times daily before a meal.  . Insulin Aspart (NOVOLOG FLEXPEN [place]) Inject 25-31 Units into the skin 3 (three) times daily with meals.  . Insulin Glargine (LANTUS SOLOSTAR [place]) Inject 100 Units into the skin at bedtime.   Marland Kitchen. lisinopril (PRINIVIL,ZESTRIL) 10 MG tablet Take 10 mg by mouth daily.  . metFORMIN (GLUCOPHAGE) 500 MG tablet Take 1 tablet (500 mg total) by mouth 2 (two) times daily with a meal. (Patient taking differently: Take 500 mg by mouth daily with breakfast. )  . Potassium Chloride ER 20 MEQ TBCR Take 20 mEq  by mouth daily.   . Continuous Blood Gluc Receiver (FREESTYLE LIBRE 14 DAY READER) DEVI 1 each by Does not apply route once for 1 dose.  . Continuous Blood Gluc Sensor (FREESTYLE LIBRE 14 DAY SENSOR) MISC Inject 1 each into the skin every 14 (fourteen) days. Use as directed.  . [DISCONTINUED] ALPRAZolam (XANAX) 0.5 MG tablet Take 0.5 mg by mouth at bedtime as needed for anxiety.  . [DISCONTINUED] aspirin 81 MG tablet Take 81 mg by mouth daily.  . [DISCONTINUED] Cholecalciferol (VITAMIN D) 2000 units CAPS Take by mouth.  . [DISCONTINUED] Misc Natural Products (PROSTATE HEALTH PO) Take by mouth.  . [DISCONTINUED] tamsulosin (FLOMAX) 0.4 MG CAPS capsule Take 0.4 mg by mouth.  . [DISCONTINUED] vitamin E 1000 UNIT capsule Take 1,000 Units by mouth daily.   No facility-administered encounter medications on file as of 01/27/2018.    ALLERGIES: No Known Allergies VACCINATION STATUS:  There is no immunization history on file for this patient.  Diabetes  He presents for his follow-up diabetic visit. He has type 2 diabetes mellitus. Onset time: He was diagnosed at approximate age of 48 years. His disease course has been fluctuating. There are no hypoglycemic associated symptoms. Pertinent negatives for hypoglycemia include no confusion, headaches, pallor or seizures. Associated symptoms include blurred vision, polydipsia and polyuria. Pertinent negatives for diabetes include  no chest pain, no fatigue, no polyphagia and no weakness. There are no hypoglycemic complications. Symptoms are worsening. Diabetic complications include a CVA. Risk factors for coronary artery disease include diabetes mellitus, dyslipidemia, family history, hypertension, male sex, obesity, sedentary lifestyle and tobacco exposure. Current diabetic treatment includes insulin injections (He changed around the doses of NovoLog 70/30 taking up to 120 units at times despite my advice for him to take 60 units twice a day with breakfast and  supper.). He is compliant with treatment some of the time. His weight is increasing steadily. He is following a generally unhealthy diet. He has not had a previous visit with a dietitian. He never participates in exercise. His breakfast blood glucose range is generally 110-130 mg/dl. His lunch blood glucose range is generally 180-200 mg/dl. His dinner blood glucose range is generally >200 mg/dl. His bedtime blood glucose range is generally >200 mg/dl. His overall blood glucose range is >200 mg/dl. An ACE inhibitor/angiotensin II receptor blocker is being taken.  Hypertension  This is a chronic problem. The current episode started more than 1 year ago. The problem is uncontrolled. Associated symptoms include blurred vision. Pertinent negatives include no chest pain, headaches, neck pain or palpitations. Risk factors for coronary artery disease include dyslipidemia, diabetes mellitus, obesity, male gender, family history, smoking/tobacco exposure and sedentary lifestyle. Past treatments include ACE inhibitors. Hypertensive end-organ damage includes CVA.   Review of Systems  Constitutional: Negative for chills, fatigue, fever and unexpected weight change.  HENT: Negative for dental problem, mouth sores and trouble swallowing.   Eyes: Positive for blurred vision. Negative for visual disturbance.  Respiratory: Positive for cough. Negative for choking, chest tightness and wheezing.   Cardiovascular: Negative for chest pain, palpitations and leg swelling.  Gastrointestinal: Negative for abdominal distention, abdominal pain, constipation, diarrhea, nausea and vomiting.  Endocrine: Positive for polydipsia and polyuria. Negative for polyphagia.  Genitourinary: Negative for dysuria, flank pain, hematuria and urgency.  Musculoskeletal: Negative for back pain, gait problem, myalgias and neck pain.  Skin: Negative for pallor, rash and wound.  Neurological: Negative for seizures, syncope, weakness, numbness and  headaches.  Psychiatric/Behavioral: Negative for confusion and dysphoric mood.    Objective:    BP (!) 161/96   Pulse 93   Ht 5' 7.5" (1.715 m)   Wt (!) 317 lb (143.8 kg)   BMI 48.92 kg/m   Wt Readings from Last 3 Encounters:  01/27/18 (!) 317 lb (143.8 kg)  10/25/17 (!) 314 lb 6.4 oz (142.6 kg)  07/09/17 (!) 308 lb (139.7 kg)    Physical Exam  Constitutional: He is oriented to person, place, and time. He appears well-developed. He is cooperative. No distress.  Poor personal hygiene.  HENT:  Head: Normocephalic and atraumatic.  Eyes: EOM are normal.  Neck: Normal range of motion. Neck supple. No tracheal deviation present. No thyromegaly present.  Cardiovascular: Normal rate, S1 normal and S2 normal. Exam reveals no gallop.  No murmur heard. Pulses:      Dorsalis pedis pulses are 1+ on the right side, and 1+ on the left side.       Posterior tibial pulses are 1+ on the right side, and 1+ on the left side.  Pulmonary/Chest: Breath sounds normal. No respiratory distress. He has no wheezes.  Abdominal: Soft. Bowel sounds are normal. He exhibits distension. There is no tenderness. There is no guarding and no CVA tenderness.  Musculoskeletal: Normal range of motion. He exhibits no edema.       Right  shoulder: He exhibits no swelling and no deformity.  Neurological: He is alert and oriented to person, place, and time. He has normal strength and normal reflexes. No cranial nerve deficit or sensory deficit. Gait normal.  Skin: Skin is warm and dry. No rash noted. No cyanosis. Nails show no clubbing.  Psychiatric: He has a normal mood and affect. His speech is normal. Cognition and memory are normal.   Recent Results (from the past 2160 hour(s))  VITAMIN D 25 Hydroxy (Vit-D Deficiency, Fractures)     Status: None   Collection Time: 01/16/18 12:00 AM  Result Value Ref Range   Vit D, 25-Hydroxy 13.4   Basic metabolic panel     Status: None   Collection Time: 01/16/18 12:00 AM  Result  Value Ref Range   BUN 15 4 - 21   Creatinine 1.1 0.6 - 1.3  Lipid panel     Status: None   Collection Time: 01/16/18 12:00 AM  Result Value Ref Range   Triglycerides 61 40 - 160   Cholesterol 115 0 - 200   HDL 45 35 - 70   LDL Cholesterol 58   Hemoglobin A1c     Status: None   Collection Time: 01/16/18 12:00 AM  Result Value Ref Range   Hemoglobin A1C 10.5      Assessment & Plan:   1. Uncontrolled type 2 diabetes mellitus with complication, with long-term current use of insulin (HCC)   - Patient has currently uncontrolled symptomatic type 2 DM since  72 years of age. -Since his last visit, he cold for persistent hyperglycemia when he was advised to increase his Lantus to 100 units nightly.   -More recently, he documented fasting hypoglycemia as low as 48 in the last 2 weeks.    - His recent labs show A1c still high at 10.5%, increasing from 9.8%.    - His recent labs are reviewed showing normal renal function, he is tolerating low-dose metformin.   His diabetes is complicated by CVA, noncompliance, obesity/sedentary life, unaffordable medications and patient remains at a high risk for more acute and chronic complications of diabetes which include CAD, CVA, CKD, retinopathy, and neuropathy. These are all discussed in detail with the patient.  - I have counseled the patient on diet management and weight loss, by adopting a carbohydrate restricted/protein rich diet.  -  Suggestion is made for him to avoid simple carbohydrates  from his diet including Cakes, Sweet Desserts / Pastries, Ice Cream, Soda (diet and regular), Sweet Tea, Candies, Chips, Cookies, Store Bought Juices, Alcohol in Excess of  1-2 drinks a day, Artificial Sweeteners, and "Sugar-free" Products. This will help patient to have stable blood glucose profile and potentially avoid unintended weight gain.  - I encouraged the patient to switch to  unprocessed or minimally processed complex starch and increased protein  intake (animal or plant source), fruits, and vegetables.  - Patient is advised to stick to a routine mealtimes to eat 3 meals  a day and avoid unnecessary snacks ( to snack only to correct hypoglycemia).   - I have approached patient with the following individualized plan to manage diabetes and patient agrees:   - He is struggling to control diabetes, this is mainly due to his dietary indiscretion- follows random meal and insulin timing. -I advised him to continue monitoring blood glucose strictly at before meals and at bedtime-4 times a day. -Due to fasting hypoglycemia, I advised him to lower his Lantus to 80 units nightly, continue  NovoLog 25 units 3 times a day before meals for pre-meal blood glucose above 90 mg/dL. He is also given individualized correction dose from NovoLog for blood glucose readings above 150 mg/dL.  - I had a long discussion with him to maximize his caloric/carb restriction . -He will benefit from continuous glucose monitoring.  I discussed and initiated a prescription for the [facility] device for him.  -Patient is encouraged to call clinic for blood glucose levels less than 70 or above 300 mg /dl.  - I advised him to continue low-dose metformin 500 mg by mouth twice a day after breakfast and supper.  - Patient specific target  A1c;  LDL, HDL, Triglycerides, and  Waist Circumference were discussed in detail.  2) BP/HTN: His blood pressure is not controlled to target, skipped his morning medications this morning.  He has lisinopril 10 mg p.o. daily, Lasix 40 mg p.o. daily as well.  He will be considered for low-dose clonidine therapy if blood pressure remains above target by next visit.  3) Lipids/HPL:  controlled LDL at 58 . He is not on statins.  4)  Weight/Diet: Continues to gain weight,  CDE Consult  in progress  , exercise, and detailed carbohydrates information provided.  5) vitamin D deficiency: -  initiated vitamin D supplement with vitamin D3 5000 units  daily for the next 90 days.  6) Chronic Care/Health Maintenance:  -Patient is on ACEI/ARB  medications and encouraged to continue to follow up with Ophthalmology, Podiatrist at least yearly or according to recommendations, and advised to  quit smoking. I have recommended yearly flu vaccine and pneumonia vaccination at least every 5 years; and  sleep for at least 7 hours a day. He cannot tolerate any significant amount of exercise.    - I advised patient to maintain close follow up with Gennette Pac, NP for primary care needs.  - Time spent with the patient: 25 min, of which >50% was spent in reviewing his blood glucose logs , discussing his hypo- and hyper-glycemic episodes, reviewing his current and  previous labs and insulin doses and developing a plan to avoid hypo- and hyper-glycemia. Please refer to Patient Instructions for Blood Glucose Monitoring and Insulin/Medications Dosing Guide"  in media tab for additional information.   Follow up plan: - Return in about 3 months (around 04/29/2018).  Marquis Lunch, MD Phone: [phone]  Fax: [phone]  -  This note was partially dictated with voice recognition software. Similar sounding words can be transcribed inadequately or may not  be corrected upon review.  01/27/2018, 10:54 AM

## 2018-04-22 LAB — BASIC METABOLIC PANEL
BUN: 15 (ref 4–21)
Creatinine: 1.3 (ref 0.6–1.3)

## 2018-04-22 LAB — HEMOGLOBIN A1C: HEMOGLOBIN A1C: 10.1

## 2018-04-29 ENCOUNTER — Ambulatory Visit (INDEPENDENT_AMBULATORY_CARE_PROVIDER_SITE_OTHER): Payer: Medicare HMO | Admitting: "Endocrinology

## 2018-04-29 ENCOUNTER — Encounter: Payer: Self-pay | Admitting: "Endocrinology

## 2018-04-29 VITALS — BP 162/92 | HR 102 | Ht 67.5 in | Wt 310.0 lb

## 2018-04-29 DIAGNOSIS — E1165 Type 2 diabetes mellitus with hyperglycemia: Secondary | ICD-10-CM | POA: Diagnosis not present

## 2018-04-29 DIAGNOSIS — IMO0002 Reserved for concepts with insufficient information to code with codable children: Secondary | ICD-10-CM

## 2018-04-29 DIAGNOSIS — I10 Essential (primary) hypertension: Secondary | ICD-10-CM

## 2018-04-29 DIAGNOSIS — Z794 Long term (current) use of insulin: Secondary | ICD-10-CM

## 2018-04-29 DIAGNOSIS — E118 Type 2 diabetes mellitus with unspecified complications: Secondary | ICD-10-CM | POA: Diagnosis not present

## 2018-04-29 MED ORDER — CLONIDINE HCL 0.2 MG PO TABS: 0.2000 mg | ORAL_TABLET | Freq: Two times a day (BID) | ORAL | 3 refills | Status: DC

## 2018-04-29 NOTE — Patient Instructions (Signed)

## 2018-04-29 NOTE — Progress Notes (Signed)
Subjective:    Patient ID: Jeff Garcia, male    DOB: [birth year]. Patient is being seen in follow-up for management of diabetes requested by  Gennette Pac, NP  Past Medical History:  Diagnosis Date  . Asthma   . BPH (benign prostatic hyperplasia)   . COPD (chronic obstructive pulmonary disease) (HCC)   . Diabetes mellitus without complication (HCC)   . Hyperlipidemia   . Hypertension   . Neuropathy   . Stroke [facility])    History reviewed. No pertinent surgical history. Social History   Socioeconomic History  . Marital status: Single    Spouse name: Not on file  . Number of children: Not on file  . Years of education: Not on file  . Highest education level: Not on file  Occupational History  . Not on file  Social Needs  . Financial resource strain: Not on file  . Food insecurity:    Worry: Not on file    Inability: Not on file  . Transportation needs:    Medical: Not on file    Non-medical: Not on file  Tobacco Use  . Smoking status: Current Every Day Smoker  . Smokeless tobacco: Never Used  Substance and Sexual Activity  . Alcohol use: Yes    Alcohol/week: 0.0 oz  . Drug use: Yes    Types: Cocaine  . Sexual activity: Not on file  Lifestyle  . Physical activity:    Days per week: Not on file    Minutes per session: Not on file  . Stress: Not on file  Relationships  . Social connections:    Talks on phone: Not on file    Gets together: Not on file    Attends religious service: Not on file    Active member of club or organization: Not on file    Attends meetings of clubs or organizations: Not on file    Relationship status: Not on file  Other Topics Concern  . Not on file  Social History Narrative  . Not on file   Outpatient Encounter Medications as of 04/29/2018  Medication Sig  . Cholecalciferol (VITAMIN D3) 5000 units CAPS Take 1 capsule (5,000 Units total) by mouth daily.  . Continuous Blood Gluc Sensor (FREESTYLE LIBRE 14 DAY SENSOR) MISC Inject 1  each into the skin every 14 (fourteen) days. Use as directed.  . furosemide (LASIX) 40 MG tablet Take 40 mg by mouth daily.  . Insulin Aspart (NOVOLOG FLEXPEN [place]) Inject 25-31 Units into the skin 3 (three) times daily with meals.  . Insulin Glargine (LANTUS SOLOSTAR [place]) Inject 80 Units into the skin at bedtime.  Marland Kitchen lisinopril (PRINIVIL,ZESTRIL) 10 MG tablet Take 10 mg by mouth daily.  . metFORMIN (GLUCOPHAGE) 500 MG tablet Take 1 tablet (500 mg total) by mouth 2 (two) times daily with a meal.  . Potassium Chloride ER 20 MEQ TBCR Take 20 mEq by mouth daily.   . [DISCONTINUED] glipiZIDE (GLUCOTROL) 10 MG tablet Take 10 mg by mouth 2 (two) times daily before a meal.   No facility-administered encounter medications on file as of 04/29/2018.    ALLERGIES: No Known Allergies VACCINATION STATUS:  There is no immunization history on file for this patient.  Diabetes  He presents for his follow-up diabetic visit. He has type 2 diabetes mellitus. Onset time: He was diagnosed at approximate age of 48 years. His disease course has been fluctuating. There are no hypoglycemic associated symptoms. Pertinent negatives for hypoglycemia include no  confusion, headaches, pallor or seizures. Associated symptoms include blurred vision, polydipsia and polyuria. Pertinent negatives for diabetes include no chest pain, no fatigue, no polyphagia and no weakness. There are no hypoglycemic complications. Symptoms are worsening. Diabetic complications include a CVA. Risk factors for coronary artery disease include diabetes mellitus, dyslipidemia, family history, hypertension, male sex, obesity, sedentary lifestyle and tobacco exposure. Current diabetic treatment includes insulin injections (He changed around the doses of NovoLog 70/30 taking up to 120 units at times despite my advice for him to take 60 units twice a day with breakfast and supper.). He is compliant with treatment some of the time. His weight is decreasing steadily.  He is following a generally unhealthy diet. He has not had a previous visit with a dietitian. He never participates in exercise. His breakfast blood glucose range is generally 180-200 mg/dl. His lunch blood glucose range is generally 180-200 mg/dl. His dinner blood glucose range is generally >200 mg/dl. His bedtime blood glucose range is generally >200 mg/dl. His overall blood glucose range is >200 mg/dl. An ACE inhibitor/angiotensin II receptor blocker is being taken.  Hypertension  This is a chronic problem. The current episode started more than 1 year ago. The problem is uncontrolled. Associated symptoms include blurred vision. Pertinent negatives include no chest pain, headaches, neck pain or palpitations. Risk factors for coronary artery disease include dyslipidemia, diabetes mellitus, obesity, male gender, family history, smoking/tobacco exposure and sedentary lifestyle. Past treatments include ACE inhibitors. Hypertensive end-organ damage includes CVA.   Review of Systems  Constitutional: Negative for chills, fatigue, fever and unexpected weight change.  HENT: Negative for dental problem, mouth sores and trouble swallowing.   Eyes: Positive for blurred vision. Negative for visual disturbance.  Respiratory: Positive for cough. Negative for choking, chest tightness and wheezing.   Cardiovascular: Negative for chest pain, palpitations and leg swelling.  Gastrointestinal: Negative for abdominal distention, abdominal pain, constipation, diarrhea, nausea and vomiting.  Endocrine: Positive for polydipsia and polyuria. Negative for polyphagia.  Genitourinary: Negative for dysuria, flank pain, hematuria and urgency.  Musculoskeletal: Negative for back pain, gait problem, myalgias and neck pain.  Skin: Negative for pallor, rash and wound.  Neurological: Negative for seizures, syncope, weakness, numbness and headaches.  Psychiatric/Behavioral: Negative for confusion and dysphoric mood.    Objective:     BP (!) 162/92   Pulse (!) 102   Ht 5' 7.5" (1.715 m)   Wt (!) 310 lb (140.6 kg)   BMI 47.84 kg/m   Wt Readings from Last 3 Encounters:  04/29/18 (!) 310 lb (140.6 kg)  01/27/18 (!) 317 lb (143.8 kg)  10/25/17 (!) 314 lb 6.4 oz (142.6 kg)    Physical Exam  Constitutional: He is oriented to person, place, and time. He appears well-developed. He is cooperative. No distress.  Poor personal hygiene.  HENT:  Head: Normocephalic and atraumatic.  Neck: Normal range of motion. Neck supple. No tracheal deviation present. No thyromegaly present.  Cardiovascular: Normal rate, S1 normal and S2 normal. Exam reveals no gallop.  No murmur heard. Pulses:      Dorsalis pedis pulses are 1+ on the right side, and 1+ on the left side.       Posterior tibial pulses are 1+ on the right side, and 1+ on the left side.  Pulmonary/Chest: Effort normal. No respiratory distress. He has no wheezes.  Abdominal: Bowel sounds are normal. He exhibits no distension. There is no tenderness. There is no guarding and no CVA tenderness.  Musculoskeletal: Normal range  of motion. He exhibits no edema.       Right shoulder: He exhibits no swelling and no deformity.  Neurological: He is alert and oriented to person, place, and time. He has normal strength. No cranial nerve deficit or sensory deficit. Gait normal.  Skin: Skin is warm and dry. No rash noted. No cyanosis. Nails show no clubbing.  Psychiatric: He has a normal mood and affect. His speech is normal. Cognition and memory are normal.   Recent Results (from the past 2160 hour(s))  Basic metabolic panel     Status: None   Collection Time: 04/22/18 12:00 AM  Result Value Ref Range   BUN 15 4 - 21   Creatinine 1.3 0.6 - 1.3  Hemoglobin A1c     Status: None   Collection Time: 04/22/18 12:00 AM  Result Value Ref Range   Hemoglobin A1C 10.1      Assessment & Plan:   1. Uncontrolled type 2 diabetes mellitus with complication, with long-term current use of  insulin (HCC)   - Patient has currently uncontrolled symptomatic type 2 DM since  [age] years of age. -He presents with blood glucose readings still above target, registers rare and random hypoglycemic episodes to as low as 50s.    - His recent labs show A1c tightly better at 10.1%.   - His recent labs are reviewed showing normal renal function, he is tolerating low-dose metformin.   His diabetes is complicated by CVA, noncompliance, obesity/sedentary life, unaffordable medications and patient remains at a high risk for more acute and chronic complications of diabetes which include CAD, CVA, CKD, retinopathy, and neuropathy. These are all discussed in detail with the patient.  - I have counseled the patient on diet management and weight loss, by adopting a carbohydrate restricted/protein rich diet. -He admits to dietary indiscretion.  -  Suggestion is made for him to avoid simple carbohydrates  from his diet including Cakes, Sweet Desserts / Pastries, Ice Cream, Soda (diet and regular), Sweet Tea, Candies, Chips, Cookies, Store Bought Juices, Alcohol in Excess of  1-2 drinks a day, Artificial Sweeteners, and "Sugar-free" Products. This will help patient to have stable blood glucose profile and potentially avoid unintended weight gain.   - I encouraged the patient to switch to  unprocessed or minimally processed complex starch and increased protein intake (animal or plant source), fruits, and vegetables.  - Patient is advised to stick to a routine mealtimes to eat 3 meals  a day and avoid unnecessary snacks ( to snack only to correct hypoglycemia).   - I have approached patient with the following individualized plan to manage diabetes and patient agrees:   - He is struggling to control diabetes, this is mainly due to his dietary indiscretion- follows random meal and insulin timing. -I advised him to continue monitoring blood glucose strictly at before meals and at bedtime-4 times a day. -I  advised him to continue Lantus 80  units nightly, continue NovoLog 25 units 3 times a day before meals for pre-meal blood glucose above 90 mg/dL. He is also given individualized correction dose from NovoLog for blood glucose readings above 150 mg/dL.  - I had a long discussion with him to maximize his caloric/carb restriction . -He would have benefited from continuous glucose monitoring.  His insurance did not provide coverage.   -Patient is encouraged to call clinic for blood glucose levels less than 70 or above 300 mg /dl.  - I advised him to continue low-dose metformin 500  mg by mouth twice a day after breakfast and supper.  -I advised him once again to discontinue glipizide.  He was previously advised to not take glipizide due to fluctuating blood glucose profile.  - Patient specific target  A1c;  LDL, HDL, Triglycerides, and  Waist Circumference were discussed in detail.  2) BP/HTN: His blood pressure is not controlled to target.  He has enough blood pressure medications.  However, he is not consistent taking his medications on time, continues to smoke heavily.      He has lisinopril 10 mg p.o. daily, Lasix 40 mg p.o. daily as well.  I will add clonidine 0.2 mg p.o. twice daily.     3) Lipids/HPL: His lipid panel showed controlled LDL at 58.   He is not on statins.  4)  Weight/Diet: Continues to gain weight,  CDE Consult  in progress  , exercise, and detailed carbohydrates information provided.  5) vitamin D deficiency: -  initiated vitamin D supplement with vitamin D3 5000 units daily for the next 90 days.  6) Chronic Care/Health Maintenance:  -Patient is on ACEI/ARB  medications and encouraged to continue to follow up with Ophthalmology, Podiatrist at least yearly or according to recommendations, and advised to  quit smoking. I have recommended yearly flu vaccine and pneumonia vaccination at least every 5 years; and  sleep for at least 7 hours a day. He cannot tolerate any  significant amount of exercise.    - I advised patient to maintain close follow up with Gennette Pac, NP for primary care needs.  - Time spent with the patient: 25 min, of which >50% was spent in reviewing his blood glucose logs , discussing his hypo- and hyper-glycemic episodes, reviewing his current and  previous labs and insulin doses and developing a plan to avoid hypo- and hyper-glycemia. Please refer to Patient Instructions for Blood Glucose Monitoring and Insulin/Medications Dosing Guide"  in media tab for additional information. Janice Coffin participated in the discussions, expressed understanding, and voiced agreement with the above plans.  All questions were answered to his satisfaction. he is encouraged to contact clinic should he have any questions or concerns prior to his return visit.   Follow up plan: - Return in about 3 months (around 07/30/2018) for meter, and logs.  Marquis Lunch, MD Phone: [phone]  Fax: [phone]  -  This note was partially dictated with voice recognition software. Similar sounding words can be transcribed inadequately or may not  be corrected upon review.  04/29/2018, 10:51 AM

## 2018-07-18 ENCOUNTER — Ambulatory Visit: Payer: Medicare HMO | Admitting: Urology

## 2018-07-18 DIAGNOSIS — N50811 Right testicular pain: Secondary | ICD-10-CM

## 2018-07-18 DIAGNOSIS — M545 Low back pain: Secondary | ICD-10-CM

## 2018-07-18 DIAGNOSIS — N4341 Spermatocele of epididymis, single: Secondary | ICD-10-CM | POA: Diagnosis not present

## 2018-07-18 DIAGNOSIS — N50812 Left testicular pain: Secondary | ICD-10-CM

## 2018-07-18 DIAGNOSIS — N3281 Overactive bladder: Secondary | ICD-10-CM

## 2018-07-18 DIAGNOSIS — N401 Enlarged prostate with lower urinary tract symptoms: Secondary | ICD-10-CM | POA: Diagnosis not present

## 2018-07-23 LAB — BASIC METABOLIC PANEL
BUN: 27 — AB (ref 4–21)
CREATININE: 1.4 — AB (ref 0.6–1.3)

## 2018-07-23 LAB — HEMOGLOBIN A1C: Hemoglobin A1C: 8.3

## 2018-07-30 ENCOUNTER — Ambulatory Visit (INDEPENDENT_AMBULATORY_CARE_PROVIDER_SITE_OTHER): Payer: Medicare HMO | Admitting: "Endocrinology

## 2018-07-30 ENCOUNTER — Encounter: Payer: Self-pay | Admitting: "Endocrinology

## 2018-07-30 VITALS — BP 137/84 | HR 105 | Ht 67.5 in | Wt 316.0 lb

## 2018-07-30 DIAGNOSIS — N183 Chronic kidney disease, stage 3 unspecified: Secondary | ICD-10-CM

## 2018-07-30 DIAGNOSIS — I10 Essential (primary) hypertension: Secondary | ICD-10-CM | POA: Diagnosis not present

## 2018-07-30 DIAGNOSIS — F17200 Nicotine dependence, unspecified, uncomplicated: Secondary | ICD-10-CM

## 2018-07-30 DIAGNOSIS — E1122 Type 2 diabetes mellitus with diabetic chronic kidney disease: Secondary | ICD-10-CM | POA: Diagnosis not present

## 2018-07-30 DIAGNOSIS — E559 Vitamin D deficiency, unspecified: Secondary | ICD-10-CM

## 2018-07-30 DIAGNOSIS — Z794 Long term (current) use of insulin: Secondary | ICD-10-CM

## 2018-07-30 MED ORDER — ACCU-CHEK GUIDE ME W/DEVICE KIT: 1.0000 | PACK | 0 refills | Status: DC

## 2018-07-30 MED ORDER — GLUCOSE BLOOD VI STRP: ORAL_STRIP | 3 refills | Status: DC

## 2018-07-30 NOTE — Patient Instructions (Signed)

## 2018-07-30 NOTE — Progress Notes (Signed)
Endocrinology follow-up note  Subjective:    Patient ID: Jeff Garcia, male    DOB: 28-Sep-1946. Patient is being seen in follow-up for management of chronically uncontrolled and complicated type 2 diabetes, hypertension, hyperlipidemia. PCP: Wyatt Haste, NP  Past Medical History:  Diagnosis Date  . Asthma   . BPH (benign prostatic hyperplasia)   . COPD (chronic obstructive pulmonary disease) ([place])   . Diabetes mellitus without complication ([place])   . Hyperlipidemia   . Hypertension   . Neuropathy   . Stroke [facility])    History reviewed. No pertinent surgical history. Social History   Socioeconomic History  . Marital status: Single    Spouse name: Not on file  . Number of children: Not on file  . Years of education: Not on file  . Highest education level: Not on file  Occupational History  . Not on file  Social Needs  . Financial resource strain: Not on file  . Food insecurity:    Worry: Not on file    Inability: Not on file  . Transportation needs:    Medical: Not on file    Non-medical: Not on file  Tobacco Use  . Smoking status: Current Every Day Smoker  . Smokeless tobacco: Never Used  Substance and Sexual Activity  . Alcohol use: Yes    Alcohol/week: 0.0 standard drinks  . Drug use: Yes    Types: Cocaine  . Sexual activity: Not on file  Lifestyle  . Physical activity:    Days per week: Not on file    Minutes per session: Not on file  . Stress: Not on file  Relationships  . Social connections:    Talks on phone: Not on file    Gets together: Not on file    Attends religious service: Not on file    Active member of club or organization: Not on file    Attends meetings of clubs or organizations: Not on file    Relationship status: Not on file  Other Topics Concern  . Not on file  Social History Narrative  . Not on file   Outpatient Encounter Medications as of 07/30/2018  Medication Sig  . Blood Glucose Monitoring Suppl (ACCU-CHEK GUIDE ME)  w/Device KIT 1 Piece by Does not apply route as directed.  . Cholecalciferol (VITAMIN D3) 5000 units CAPS Take 1 capsule (5,000 Units total) by mouth daily.  . cloNIDine (CATAPRES) 0.2 MG tablet Take 1 tablet (0.2 mg total) by mouth 2 (two) times daily.  . Continuous Blood Gluc Sensor (FREESTYLE LIBRE 14 DAY SENSOR) MISC Inject 1 each into the skin every 14 (fourteen) days. Use as directed.  . furosemide (LASIX) 40 MG tablet Take 40 mg by mouth daily.  Marland Kitchen glucose blood (ACCU-CHEK GUIDE) test strip Use as instructed  . Insulin Aspart (NOVOLOG FLEXPEN [place]) Inject 25-31 Units into the skin 3 (three) times daily with meals.  . Insulin Glargine (LANTUS SOLOSTAR [place]) Inject 80 Units into the skin at bedtime.  Marland Kitchen lisinopril (PRINIVIL,ZESTRIL) 10 MG tablet Take 10 mg by mouth daily.  . metFORMIN (GLUCOPHAGE) 500 MG tablet Take 1 tablet (500 mg total) by mouth 2 (two) times daily with a meal.  . Potassium Chloride ER 20 MEQ TBCR Take 20 mEq by mouth daily.   . tamsulosin (FLOMAX) 0.4 MG CAPS capsule Take 0.4 mg by mouth daily.   No facility-administered encounter medications on file as of 07/30/2018.    ALLERGIES: No Known Allergies VACCINATION STATUS:  There is no immunization history on file for this patient.  Diabetes  He presents for his follow-up diabetic visit. He has type 2 diabetes mellitus. Onset time: He was diagnosed at approximate age of 44 years. His disease course has been improving. There are no hypoglycemic associated symptoms. Pertinent negatives for hypoglycemia include no confusion, headaches, pallor or seizures. Associated symptoms include blurred vision, polydipsia and polyuria. Pertinent negatives for diabetes include no chest pain, no fatigue, no polyphagia and no weakness. There are no hypoglycemic complications. Symptoms are worsening. Diabetic complications include a CVA. Risk factors for coronary artery disease include diabetes mellitus, dyslipidemia, family history, hypertension,  male sex, obesity, sedentary lifestyle and tobacco exposure. Current diabetic treatment includes insulin injections (He changed around the doses of NovoLog 70/30 taking up to 120 units at times despite my advice for him to take 60 units twice a day with breakfast and supper.). He is compliant with treatment some of the time. His weight is increasing steadily. He is following a generally unhealthy diet. He has not had a previous visit with a dietitian. He never participates in exercise. His home blood glucose trend is fluctuating minimally. His breakfast blood glucose range is generally 180-200 mg/dl. His lunch blood glucose range is generally 180-200 mg/dl. His dinner blood glucose range is generally 180-200 mg/dl. His bedtime blood glucose range is generally 180-200 mg/dl. His overall blood glucose range is 180-200 mg/dl. An ACE inhibitor/angiotensin II receptor blocker is being taken.  Hypertension  This is a chronic problem. The current episode started more than 1 year ago. The problem is uncontrolled. Associated symptoms include blurred vision. Pertinent negatives include no chest pain, headaches, neck pain or palpitations. Risk factors for coronary artery disease include dyslipidemia, diabetes mellitus, obesity, male gender, family history, smoking/tobacco exposure and sedentary lifestyle. Past treatments include ACE inhibitors. Hypertensive end-organ damage includes CVA.   Review of Systems  Constitutional: Negative for chills, fatigue, fever and unexpected weight change.  HENT: Negative for dental problem, mouth sores and trouble swallowing.   Eyes: Positive for blurred vision. Negative for visual disturbance.  Respiratory: Positive for cough. Negative for choking, chest tightness and wheezing.   Cardiovascular: Negative for chest pain, palpitations and leg swelling.  Gastrointestinal: Negative for abdominal distention, abdominal pain, constipation, diarrhea, nausea and vomiting.  Endocrine:  Positive for polydipsia and polyuria. Negative for polyphagia.  Genitourinary: Negative for dysuria, flank pain, hematuria and urgency.  Musculoskeletal: Negative for back pain, gait problem, myalgias and neck pain.  Skin: Negative for pallor, rash and wound.  Neurological: Negative for seizures, syncope, weakness, numbness and headaches.  Psychiatric/Behavioral: Negative for confusion and dysphoric mood.    Objective:    BP 137/84   Pulse (!) 105   Ht 5' 7.5" (1.715 m)   Wt (!) 316 lb (143.3 kg)   BMI 48.76 kg/m   Wt Readings from Last 3 Encounters:  07/30/18 (!) 316 lb (143.3 kg)  04/29/18 (!) 310 lb (140.6 kg)  01/27/18 (!) 317 lb (143.8 kg)    Physical Exam  Constitutional: He is oriented to person, place, and time. He appears well-developed. He is cooperative. No distress.  Poor personal hygiene.  HENT:  Head: Normocephalic and atraumatic.  Neck: Normal range of motion. Neck supple. No tracheal deviation present. No thyromegaly present.  Cardiovascular: Normal rate, S1 normal and S2 normal. Exam reveals no gallop.  No murmur heard. Pulses:      Dorsalis pedis pulses are 1+ on the right side, and 1+ on the  left side.       Posterior tibial pulses are 1+ on the right side, and 1+ on the left side.  Pulmonary/Chest: Effort normal. No respiratory distress. He has no wheezes.  Abdominal: Bowel sounds are normal. He exhibits no distension. There is no tenderness. There is no guarding and no CVA tenderness.  Musculoskeletal: Normal range of motion. He exhibits no edema.       Right shoulder: He exhibits no swelling and no deformity.  Neurological: He is alert and oriented to person, place, and time. He has normal strength. No cranial nerve deficit or sensory deficit. Gait normal.  Skin: Skin is warm and dry. No rash noted. No cyanosis. Nails show no clubbing.  Psychiatric: He has a normal mood and affect. His speech is normal. Cognition and memory are normal.   Recent Results  (from the past 2160 hour(s))  Basic metabolic panel     Status: Abnormal   Collection Time: 07/23/18 12:00 AM  Result Value Ref Range   BUN 27 (A) 4 - 21   Creatinine 1.4 (A) 0.6 - 1.3  Hemoglobin A1c     Status: None   Collection Time: 07/23/18 12:00 AM  Result Value Ref Range   Hemoglobin A1C 8.3      Assessment & Plan:   1. Uncontrolled type 2 diabetes mellitus with complication, with long-term current use of insulin ([place])   - Patient has currently uncontrolled symptomatic type 2 DM since  72 years of age. -He presents with improving blood glucose profile and A1c of 8.3% improving from 10.1%.     - His recent labs are reviewed showing mild renal insufficiency,  he is tolerating low-dose metformin.   His diabetes is complicated by CVA, noncompliance, obesity/sedentary life, unaffordable medications and patient remains at a high risk for more acute and chronic complications of diabetes which include CAD, CVA, CKD, retinopathy, and neuropathy. These are all discussed in detail with the patient.  - I have counseled the patient on diet management and weight loss, by adopting a carbohydrate restricted/protein rich diet. -He admits to dietary indiscretion.  -  Suggestion is made for him to avoid simple carbohydrates  from his diet including Cakes, Sweet Desserts / Pastries, Ice Cream, Soda (diet and regular), Sweet Tea, Candies, Chips, Cookies, Store Bought Juices, Alcohol in Excess of  1-2 drinks a day, Artificial Sweeteners, and "Sugar-free" Products. This will help patient to have stable blood glucose profile and potentially avoid unintended weight gain.  - I encouraged the patient to switch to  unprocessed or minimally processed complex starch and increased protein intake (animal or plant source), fruits, and vegetables.  - Patient is advised to stick to a routine mealtimes to eat 3 meals  a day and avoid unnecessary snacks ( to snack only to correct hypoglycemia).   - I have  approached patient with the following individualized plan to manage diabetes and patient agrees:   - He is struggling to control diabetes, this is mainly due to his dietary indiscretion- follows random meal and insulin timing. -I advised him to continue monitoring blood glucose 4 times a day-before meals and at bedtime.    -I advised him to continue Lantus 80  units nightly, continue NovoLog 25 units 3 times a day before meals for pre-meal blood glucose above 90 mg/dL. He is also given individualized correction dose from NovoLog for blood glucose readings above 150 mg/dL.  - I had a long discussion with him to maximize his caloric/carb restriction. -  He would have benefited from continuous glucose monitoring.  His insurance did not provide coverage.  -Patient is encouraged to call clinic for blood glucose levels less than 70 or above 300 mg /dl.  - I advised him to continue low-dose metformin 500 mg by mouth twice a day after breakfast and supper.  -A new meter and supplies is prescribed for him.  - Patient specific target  A1c;  LDL, HDL, Triglycerides, and  Waist Circumference were discussed in detail.  2) BP/HTN: His blood pressure is controlled to target.  He is advised to continue lisinopril 10 mg p.o. daily, Lasix 40 mg p.o. daily as well.  He is tolerating clonidine 0.2 mg twice a day, and clonidine is helping.   3) Lipids/HPL: His lipid panel showed controlled LDL at 58.   He is not on statins.  4)  Weight/Diet: Continues to gain weight,  CDE Consult  in progress  , exercise, and detailed carbohydrates information provided.  5) vitamin D deficiency: -He is on ongoing treatment with vitamin D3 5000 units daily.     6) Chronic Care/Health Maintenance:  -Patient is on ACEI/ARB  medications and encouraged to continue to follow up with Ophthalmology, Podiatrist at least yearly or according to recommendations, and advised to  quit smoking. I have recommended yearly flu vaccine and  pneumonia vaccination at least every 5 years; and  sleep for at least 7 hours a day. He cannot tolerate any significant amount of exercise.    - I advised patient to maintain close follow up with Wyatt Haste, NP for primary care needs.  - Time spent with the patient: 25 min, of which >50% was spent in reviewing his blood glucose logs , discussing his hypo- and hyper-glycemic episodes, reviewing his current and  previous labs and insulin doses and developing a plan to avoid hypo- and hyper-glycemia. Please refer to Patient Instructions for Blood Glucose Monitoring and Insulin/Medications Dosing Guide"  in media tab for additional information. Denyce Robert participated in the discussions, expressed understanding, and voiced agreement with the above plans.  All questions were answered to his satisfaction. he is encouraged to contact clinic should he have any questions or concerns prior to his return visit.  Follow up plan: - Return in about 4 months (around 11/29/2018) for Meter, and Logs.  Glade Lloyd, MD Phone: [phone]  Fax: [phone]  -  This note was partially dictated with voice recognition software. Similar sounding words can be transcribed inadequately or may not  be corrected upon review.  07/30/2018, 10:43 AM

## 2018-08-05 ENCOUNTER — Other Ambulatory Visit: Payer: Self-pay | Admitting: "Endocrinology

## 2018-08-05 MED ORDER — BLOOD GLUCOSE MONITOR KIT: PACK | 5 refills | Status: DC

## 2018-08-29 ENCOUNTER — Ambulatory Visit: Payer: Medicare HMO | Admitting: Urology

## 2018-08-29 DIAGNOSIS — N5082 Scrotal pain: Secondary | ICD-10-CM | POA: Diagnosis not present

## 2018-08-29 DIAGNOSIS — N35011 Post-traumatic bulbous urethral stricture: Secondary | ICD-10-CM | POA: Diagnosis not present

## 2018-08-29 DIAGNOSIS — R350 Frequency of micturition: Secondary | ICD-10-CM

## 2018-08-29 DIAGNOSIS — N401 Enlarged prostate with lower urinary tract symptoms: Secondary | ICD-10-CM

## 2018-08-29 DIAGNOSIS — R81 Glycosuria: Secondary | ICD-10-CM

## 2018-08-30 ENCOUNTER — Other Ambulatory Visit: Payer: Self-pay | Admitting: "Endocrinology

## 2018-09-04 ENCOUNTER — Other Ambulatory Visit: Payer: Self-pay | Admitting: Urology

## 2018-09-05 ENCOUNTER — Encounter (HOSPITAL_COMMUNITY)
Admission: RE | Admit: 2018-09-05 | Discharge: 2018-09-05 | Disposition: A | Discharge status: Home / Self Care | Payer: Medicare HMO | Source: Ambulatory Visit | Attending: Urology | Admitting: Urology

## 2018-09-05 ENCOUNTER — Encounter (HOSPITAL_COMMUNITY): Payer: Self-pay

## 2018-09-05 ENCOUNTER — Ambulatory Visit (HOSPITAL_COMMUNITY)
Admission: RE | Admit: 2018-09-05 | Discharge: 2018-09-05 | Disposition: A | Discharge status: Home / Self Care | Payer: Medicare HMO | Source: Ambulatory Visit | Attending: Anesthesiology | Admitting: Anesthesiology

## 2018-09-05 ENCOUNTER — Other Ambulatory Visit: Payer: Self-pay | Admitting: Urology

## 2018-09-05 ENCOUNTER — Other Ambulatory Visit: Payer: Self-pay

## 2018-09-05 DIAGNOSIS — Z01818 Encounter for other preprocedural examination: Secondary | ICD-10-CM | POA: Diagnosis present

## 2018-09-05 DIAGNOSIS — I10 Essential (primary) hypertension: Secondary | ICD-10-CM | POA: Diagnosis not present

## 2018-09-05 LAB — GLUCOSE, CAPILLARY
GLUCOSE-CAPILLARY: 62 mg/dL — AB (ref 70–99)
Glucose-Capillary: 45 mg/dL — ABNORMAL LOW (ref 70–99)
Glucose-Capillary: 112 mg/dL — ABNORMAL HIGH (ref 70–99)

## 2018-09-05 LAB — CBC
HEMATOCRIT: 50.3 % (ref 39.0–52.0)
HEMOGLOBIN: 15.8 g/dL (ref 13.0–17.0)
MCH: 28.9 pg (ref 26.0–34.0)
MCHC: 31.4 g/dL (ref 30.0–36.0)
MCV: 92 fL (ref 80.0–100.0)
Platelets: 222 10*3/uL (ref 150–400)
RBC: 5.47 MIL/uL (ref 4.22–5.81)
RDW: 14.3 % (ref 11.5–15.5)
WBC: 6.5 10*3/uL (ref 4.0–10.5)
nRBC: 0 % (ref 0.0–0.2)

## 2018-09-05 LAB — BASIC METABOLIC PANEL
Anion gap: 11 (ref 5–15)
BUN: 34 mg/dL — AB (ref 8–23)
CHLORIDE: 106 mmol/L (ref 98–111)
CO2: 28 mmol/L (ref 22–32)
CREATININE: 1.52 mg/dL — AB (ref 0.61–1.24)
Calcium: 9.8 mg/dL (ref 8.9–10.3)
GFR calc Af Amer: 50 mL/min — ABNORMAL LOW (ref >60)
GFR calc non Af Amer: 43 mL/min — ABNORMAL LOW (ref >60)
GLUCOSE: 131 mg/dL — AB (ref 70–99)
POTASSIUM: 3.9 mmol/L (ref 3.5–5.1)
SODIUM: 145 mmol/L (ref 135–145)

## 2018-09-05 LAB — HEMOGLOBIN A1C
Hgb A1c MFr Bld: 9 % — ABNORMAL HIGH (ref 4.8–5.6)
MEAN PLASMA GLUCOSE: 211.6 mg/dL

## 2018-09-05 NOTE — Progress Notes (Signed)
   09/05/18 1119  OBSTRUCTIVE SLEEP APNEA  Have you ever been diagnosed with sleep apnea through a sleep study? No  Do you snore loudly (loud enough to be heard through closed doors)?  0  Do you often feel tired, fatigued, or sleepy during the daytime (such as falling asleep during driving or talking to someone)? 1  Has anyone observed you stop breathing during your sleep? 0  Do you have, or are you being treated for high blood pressure? 1  BMI more than 35 kg/m2? 1  Age > 50 (1-yes) 1  Neck circumference greater than:Male 16 inches or larger, Male 17inches or larger? 0  Male Gender (Yes=1) 1  Obstructive Sleep Apnea Score 5  Score 5 or greater  Results sent to PCP

## 2018-09-05 NOTE — Patient Instructions (Addendum)
Jeff Garcia  09/05/2018   Your procedure is scheduled on: 09-09-18     Report to [facility] Main  Entrance    Report to Admitting at 9:60M    Call this number if you have problems the morning of surgery [phone]   Remember: Do not eat food or drink liquids :After Midnight.     Take these medicines the morning of surgery with A SIP OF WATER: Clonidine (Catapres)   BRUSH YOUR TEETH MORNING OF SURGERY AND RINSE YOUR MOUTH OUT, NO CHEWING GUM CANDY OR MINTS.    DO NOT TAKE ANY DIABETIC MEDICATIONS DAY OF YOUR SURGERY                               You may not have any metal on your body including hair pins and              piercings  Do not wear jewelry, lotions, powders,  Cologne or deodorant             Men may shave face and neck.   Do not bring valuables to the hospital.  IS NOT             RESPONSIBLE   FOR VALUABLES.  Contacts, dentures or bridgework may not be worn into surgery.  Leave suitcase in the car. After surgery it may be brought to your room.     Special Instructions: N/A              Please read over the following fact sheets you were given: _____________________________________________________________________             How to Manage Your Diabetes Before and After Surgery  Why is it important to control my blood sugar before and after surgery? . Improving blood sugar levels before and after surgery helps healing and can limit problems. . A way of improving blood sugar control is eating a healthy diet by: o  Eating less sugar and carbohydrates o  Increasing activity/exercise o  Talking with your doctor about reaching your blood sugar goals . High blood sugars (greater than 180 mg/dL) can raise your risk of infections and slow your recovery, so you will need to focus on controlling your diabetes during the weeks before surgery. . Make sure that the doctor who takes care of your diabetes knows about your planned  surgery including the date and location.  How do I manage my blood sugar before surgery? . Check your blood sugar at least 4 times a day, starting 2 days before surgery, to make sure that the level is not too high or low. o Check your blood sugar the morning of your surgery when you wake up and every 2 hours until you get to the Short Stay unit. . If your blood sugar is less than 70 mg/dL, you will need to treat for low blood sugar: o Do not take insulin. o Treat a low blood sugar (less than 70 mg/dL) with  cup of clear juice (cranberry or apple), 4 glucose tablets, OR glucose gel. o Recheck blood sugar in 15 minutes after treatment (to make sure it is greater than 70 mg/dL). If your blood sugar is not greater than 70 mg/dL on recheck, call [phone] for further instructions. . Report your blood sugar to the short stay nurse when you get  to Short Stay.  . If you are admitted to the hospital after surgery: o Your blood sugar will be checked by the staff and you will probably be given insulin after surgery (instead of oral diabetes medicines) to make sure you have good blood sugar levels. o The goal for blood sugar control after surgery is 80-180 mg/dL.   WHAT DO I DO ABOUT MY DIABETES MEDICATION?  Marland Kitchen Do not take oral diabetes medicines (pills) the morning of surgery.  . THE DAY BEFORE SURGERY, take your usual dose of Metformin, and your 25-30 units of Novolog insulin for meals. However, only take your morning dose of Glipizide (Glucotrol), and 50 U of your bedtime dose of Lantus.  . . The day of surgery, do not take other diabetes injectables, including Byetta (exenatide), Bydureon (exenatide ER), Victoza (liraglutide), or Trulicity (dulaglutide).  . If your CBG is greater than 220 mg/dL, you may take  of your sliding scale  . (correction) dose of insulin.      Reviewed and Endorsed by [facility] Patient Education Committee, August 2015    [facility] - Preparing for  Surgery Before surgery, you can play an important role.  Because skin is not sterile, your skin needs to be as free of germs as possible.  You can reduce the number of germs on your skin by washing with CHG (chlorahexidine gluconate) soap before surgery.  CHG is an antiseptic cleaner which kills germs and bonds with the skin to continue killing germs even after washing. Please DO NOT use if you have an allergy to CHG or antibacterial soaps.  If your skin becomes reddened/irritated stop using the CHG and inform your nurse when you arrive at Short Stay. Do not shave (including legs and underarms) for at least 48 hours prior to the first CHG shower.  You may shave your face/neck. Please follow these instructions carefully:  1.  Shower with CHG Soap the night before surgery and the  morning of Surgery.  2.  If you choose to wash your hair, wash your hair first as usual with your  normal  shampoo.  3.  After you shampoo, rinse your hair and body thoroughly to remove the  shampoo.                           4.  Use CHG as you would any other liquid soap.  You can apply chg directly  to the skin and wash                       Gently with a scrungie or clean washcloth.  5.  Apply the CHG Soap to your body ONLY FROM THE NECK DOWN.   Do not use on face/ open                           Wound or open sores. Avoid contact with eyes, ears mouth and genitals (private parts).                       Wash face,  Genitals (private parts) with your normal soap.             6.  Wash thoroughly, paying special attention to the area where your surgery  will be performed.  7.  Thoroughly rinse your body with warm water from the neck down.  8.  DO  NOT shower/wash with your normal soap after using and rinsing off  the CHG Soap.                9.  Pat yourself dry with a clean towel.            10.  Wear clean pajamas.            11.  Place clean sheets on your bed the night of your first shower and do not  sleep with pets. Day  of Surgery : Do not apply any lotions/deodorants the morning of surgery.  Please wear clean clothes to the hospital/surgery center.  FAILURE TO FOLLOW THESE INSTRUCTIONS MAY RESULT IN THE CANCELLATION OF YOUR SURGERY PATIENT SIGNATURE_________________________________  NURSE SIGNATURE__________________________________  ________________________________________________________________________

## 2018-09-05 NOTE — Progress Notes (Signed)
07-23-18 (Epic) HGA1C  09-03-18 Surgical clearance on chart.

## 2018-09-05 NOTE — Progress Notes (Signed)
Pt presented for Pre-surgical testing appointment lethargic, and drowsy@ 10:50 AM. BG checked, and patient had a reading of 45. Pt provided with nutritional supplement per protocol. BG rechecked within 15 mins,, and pt's reading was 62. Pt reported that he had not eaten, but had taken insulin, and his oral diabetic medication prior to his appointment. Additional supplement given, with final reading of 112 @11 :29 AM.

## 2018-09-08 MED ORDER — DEXTROSE 5 % IV SOLN
3.0000 g | INTRAVENOUS | Status: AC
  Administered 2018-09-09: 3 g via INTRAVENOUS
  Filled 2018-09-08: qty 3

## 2018-09-08 NOTE — H&P (Signed)
CC/HPI: Frequency, Nocturia and Urgency     08/29/18: [name] returns today in f/u for cystoscopy and voiding studies. He continues to have urgency and frequency and has scrotal pain and redness that he reports is from staying wet. He has been given a topical ointment by his PCP but it is still bothersome with pain more on the right than the left.   GU Hx: [name] is a 72 yo AAM who is sent in consultation by Antony Odea NP. He has a long history of LUTS and has previously been seen by Dr. Jerre Simon or Dr. Rito Ehrlich. He has frequency, urgency with UUI and nocturia 3-5x. He has some hesitancy. He has a strong stream with UUI but it can be week with volitional voids. He doesn't always feel empty. He has intermittency. He has no dysuria but he has had gross hematuria. He has no history of stones, UTI's or GU surgery. He has not had cystoscopy that he can recall. He has bilateral testicular pain that has been present for several months. He had a scrotal US and was found to have cysts. His recent PSA was 2.4.    CC: AUA Questions Scoring.  HPI: Jeff Garcia is a 72 year-old male established patient who is here for evaluation.      AUA Symptom Score: 50% of the time he has the sensation of not emptying his bladder completely when finished urinating. Almost always he has to urinate again fewer than two hours after he has finished urinating. Less than 50% of the time he has to start and stop again several times when he urinates. More than 50% of the time he finds it difficult to postpone urination. Less than 50% of the time he has a weak urinary stream. 50% of the time he has to push or strain to begin urination. He has to get up to urinate 3 times from the time he goes to bed until the time he gets up in the morning.   Calculated AUA Symptom Score: 22    ALLERGIES: None   MEDICATIONS: Lisinopril 10 mg tablet 1 tablet PO Daily  Metformin Hcl 500 mg tablet 1 tablet PO Daily  Tamsulosin Hcl 0.4 mg  capsule 1 capsule PO Daily  Clonidine Hcl 0.2 mg tablet 1 tablet PO Daily  Furosemide 40 mg tablet 1 tablet PO Daily  Glipizide 10 mg tablet 1 tablet PO Daily  Lantus 100 unit/ml vial 1 PO Daily  Novolog 1 PO Daily  Potassium Chloride 20 meq tablet, extended release 1 tablet PO Daily  Vitamin D3 5,000 unit capsule 1 capsule PO Daily     GU PSH: No GU PSH    NON-GU PSH: Correct Bunion, Bilateral - 1996    GU PMH: BPH w/LUTS, I am going to try tamsulosin and have him return for a flowrate and cystoscopy. 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 He has back pain and the scrotal pain is probably referred and not from the right spermatocele. I am going to get him set up for PT for the back. - 07/18/2018 Overactive bladder - 07/18/2018 Right testicular pain - 07/18/2018 Spermatocele (single), 3cm Right spermatocele that was non-tender on exam. - 07/18/2018 Kidney Failure Unspec    NON-GU PMH: Asthma COPD Diabetes mellitus due to underlying condition with diabetic neuropathy, unspecified Diabetes Type 2 Hypertension Obesity Stroke/TIA    FAMILY HISTORY: Prostate  Cancer - Brother   SOCIAL HISTORY: Marital Status: Divorced Preferred Language: English; Race: Black or African American Current Smoking Status: Patient smokes. Has smoked since 06/26/1978. Smokes 1 pack per day.   Tobacco Use Assessment Completed: Used Tobacco in last 30 days? Drinks 1 drink per day.  Drinks 1 caffeinated drink per day. Patient's occupation is/was retired.    REVIEW OF SYSTEMS:    GU Review Male:   testicle pain. Patient reports frequent urination and get up at night to urinate. Patient denies hard to postpone urination, burning/ pain with urination, leakage of urine, stream starts and stops, trouble starting your stream, have to strain to urinate , erection problems, and penile pain.   Gastrointestinal (Upper):   Patient denies nausea, vomiting, and indigestion/ heartburn.  Gastrointestinal (Lower):   Patient denies diarrhea and constipation.  Constitutional:   Patient denies fever, night sweats, weight loss, and fatigue.  Skin:   Patient denies skin rash/ lesion and itching.  Eyes:   Patient denies blurred vision and double vision.  Ears/ Nose/ Throat:   Patient denies sore throat and sinus problems.  Hematologic/Lymphatic:   Patient denies swollen glands and easy bruising.  Cardiovascular:   Patient denies leg swelling and chest pains.  Respiratory:   Patient denies cough and shortness of breath.  Endocrine:   Patient denies excessive thirst.  Musculoskeletal:   Patient reports back pain. Patient denies joint pain.  Neurological:   Patient denies headaches and dizziness.  Psychologic:   Patient denies depression and anxiety.   VITAL SIGNS:      08/29/2018 09:01 AM  BP 147/83 mmHg  Heart Rate 103 /min  Temperature 99.3 F / 37.3 C   GU PHYSICAL EXAMINATION:    Scrotum: He has some mild erythema with linear ulcers over the right anterior hemiscrotum but no edema or testicular tenderness. He has a right spermatocele.    MULTI-SYSTEM PHYSICAL EXAMINATION:    Constitutional: Obese. No physical deformities. Normally developed. Good grooming.   Respiratory: No labored breathing, no use of accessory muscles. CTA  Cardiovascular: Normal temperature, RRR without murmur, moderate ankle edema.      PAST DATA REVIEWED:  Source Of History:  Patient  Records Review:   AUA Symptom Score  Urine Test Review:   Urinalysis  Urodynamics Review:   Review Flow Rate   PROCEDURES:         Flexible Cystoscopy - 52000  Risks, benefits, and some of the potential complications of the procedure were discussed. Cipro 500mg  given for antibiotic prophylaxis.     Meatus:  Normal size. Normal location. Normal condition.  Urethra:  Mild bulbous stricture. That was disrupted but the  cystoscopy.   External Sphincter:  Normal.  Verumontanum:  Normal.  Prostate:  Obstructing. Enlarged median lobe, ball valving. Moderate hyperplasia.  Bladder Neck:  Non-obstructing.  Ureteral Orifices:  Normal location. Normal size. Normal shape. Effluxed clear urine.  Bladder:  No trabeculation. No tumors. Normal mucosa. No stones.      The procedure was well tolerated and there were no complications.        Flow Rate - 51741  Total Void Time: 0:35 min:sec  Peak Flow Rate: 12 cc/sec  Average Flow Rate: 8 cc/sec  Voided Volume: 297 cc  Flow Time: 0:35 min:sec  Time of Peak Flow: 0:05 min:sec         Urinalysis - 81003 Dipstick Dipstick Cont'd  Specimen: Staff Cath Bilirubin: Neg  Color: Yellow Ketones: Neg  Appearance: Clear Blood: Trace  Intact  Specific Gravity: 1.010 Protein: Neg  pH: 5.0 Urobilinogen: 0.2  Glucose: 3+ Nitrites: Neg    Leukocyte Esterase: Trace    ASSESSMENT:      ICD-10 Details  1 GU:   BPH w/LUTS - N40.1 He has trilobar hyperplasia with severe LUTS. I have recommended a TURP and reviewed the side effects. I reviewd the risks of a TURP including bleeding, infection, incontinence, stricture, need for secondary procedures, ejaculatory and erectile dysfunction, thrombotic events, fluid overload and anesthetic complications. I explained that 95% of men will have relief of the obstructive symptoms and about 70% will have relief of the irritative symptoms.   2   Urinary Frequency - R35.0 This is probably secondary to his BPH but also the glucosuria. I will get him cleared by his PCP.   4   Scrotal pain - N50.82 He has scrotal excoriation and I have recommended petroleum jelly to protect the skin from moisture.   5   Bulbar urethral stricture - N35.011 Mild and disrupted at cystoscopy.   3 NON-GU:   Glycosuria - R81    PLAN:           Schedule Return Visit/Planned Activity: ASAP - Schedule Surgery             Note: TURP

## 2018-09-09 ENCOUNTER — Ambulatory Visit (HOSPITAL_COMMUNITY): Payer: Medicare HMO | Admitting: Certified Registered"

## 2018-09-09 ENCOUNTER — Encounter (HOSPITAL_COMMUNITY)
Admission: RE | Disposition: A | Discharge status: Home / Self Care | Payer: Self-pay | Source: Ambulatory Visit | Attending: Urology

## 2018-09-09 ENCOUNTER — Observation Stay (HOSPITAL_COMMUNITY)
Admission: RE | Admit: 2018-09-09 | Discharge: 2018-09-10 | Disposition: A | Discharge status: Home / Self Care | Payer: Medicare HMO | Source: Ambulatory Visit | Attending: Urology | Admitting: Urology

## 2018-09-09 ENCOUNTER — Other Ambulatory Visit: Payer: Self-pay

## 2018-09-09 ENCOUNTER — Encounter (HOSPITAL_COMMUNITY): Payer: Self-pay | Admitting: *Deleted

## 2018-09-09 DIAGNOSIS — I10 Essential (primary) hypertension: Secondary | ICD-10-CM | POA: Diagnosis not present

## 2018-09-09 DIAGNOSIS — N401 Enlarged prostate with lower urinary tract symptoms: Principal | ICD-10-CM | POA: Insufficient documentation

## 2018-09-09 DIAGNOSIS — Z79899 Other long term (current) drug therapy: Secondary | ICD-10-CM | POA: Insufficient documentation

## 2018-09-09 DIAGNOSIS — N35912 Unspecified bulbous urethral stricture, male: Secondary | ICD-10-CM | POA: Diagnosis not present

## 2018-09-09 DIAGNOSIS — J449 Chronic obstructive pulmonary disease, unspecified: Secondary | ICD-10-CM | POA: Diagnosis not present

## 2018-09-09 DIAGNOSIS — N5082 Scrotal pain: Secondary | ICD-10-CM | POA: Insufficient documentation

## 2018-09-09 DIAGNOSIS — Z794 Long term (current) use of insulin: Secondary | ICD-10-CM | POA: Insufficient documentation

## 2018-09-09 DIAGNOSIS — Z6841 Body Mass Index (BMI) 40.0 and over, adult: Secondary | ICD-10-CM | POA: Diagnosis not present

## 2018-09-09 DIAGNOSIS — N138 Other obstructive and reflux uropathy: Secondary | ICD-10-CM | POA: Diagnosis present

## 2018-09-09 DIAGNOSIS — E1140 Type 2 diabetes mellitus with diabetic neuropathy, unspecified: Secondary | ICD-10-CM | POA: Diagnosis not present

## 2018-09-09 DIAGNOSIS — IMO0002 Reserved for concepts with insufficient information to code with codable children: Secondary | ICD-10-CM

## 2018-09-09 DIAGNOSIS — E1165 Type 2 diabetes mellitus with hyperglycemia: Secondary | ICD-10-CM | POA: Insufficient documentation

## 2018-09-09 DIAGNOSIS — F17210 Nicotine dependence, cigarettes, uncomplicated: Secondary | ICD-10-CM | POA: Diagnosis not present

## 2018-09-09 DIAGNOSIS — E118 Type 2 diabetes mellitus with unspecified complications: Secondary | ICD-10-CM

## 2018-09-09 HISTORY — PX: TRANSURETHRAL RESECTION OF PROSTATE: SHX73

## 2018-09-09 LAB — BASIC METABOLIC PANEL
ANION GAP: 10 (ref 5–15)
BUN: 26 mg/dL — ABNORMAL HIGH (ref 8–23)
CALCIUM: 9.6 mg/dL (ref 8.9–10.3)
CO2: 28 mmol/L (ref 22–32)
Chloride: 106 mmol/L (ref 98–111)
Creatinine, Ser: 1.42 mg/dL — ABNORMAL HIGH (ref 0.61–1.24)
GFR calc Af Amer: 55 mL/min — ABNORMAL LOW (ref >60)
GFR calc non Af Amer: 47 mL/min — ABNORMAL LOW (ref >60)
GLUCOSE: 46 mg/dL — AB (ref 70–99)
POTASSIUM: 4.3 mmol/L (ref 3.5–5.1)
SODIUM: 144 mmol/L (ref 135–145)

## 2018-09-09 LAB — GLUCOSE, CAPILLARY
GLUCOSE-CAPILLARY: 40 mg/dL — AB (ref 70–99)
GLUCOSE-CAPILLARY: 55 mg/dL — AB (ref 70–99)
GLUCOSE-CAPILLARY: 93 mg/dL (ref 70–99)
GLUCOSE-CAPILLARY: 156 mg/dL — AB (ref 70–99)
GLUCOSE-CAPILLARY: 223 mg/dL — AB (ref 70–99)
GLUCOSE-CAPILLARY: 143 mg/dL — AB (ref 70–99)
GLUCOSE-CAPILLARY: 158 mg/dL — AB (ref 70–99)
GLUCOSE-CAPILLARY: 362 mg/dL — AB (ref 70–99)
Glucose-Capillary: 81 mg/dL (ref 70–99)

## 2018-09-09 LAB — GLUCOSE, RANDOM: Glucose, Bld: 48 mg/dL — ABNORMAL LOW (ref 70–99)

## 2018-09-09 SURGERY — TURP (TRANSURETHRAL RESECTION OF PROSTATE)
Anesthesia: General

## 2018-09-09 MED ORDER — DEXTROSE 50 % IV SOLN
12.5000 g | Freq: Once | INTRAVENOUS | Status: AC
  Administered 2018-09-09: 12.5 g via INTRAVENOUS

## 2018-09-09 MED ORDER — ROCURONIUM BROMIDE 100 MG/10ML IV SOLN
INTRAVENOUS | Status: DC | PRN
  Administered 2018-09-09: 30 mg via INTRAVENOUS
  Administered 2018-09-09: 10 mg via INTRAVENOUS

## 2018-09-09 MED ORDER — POTASSIUM CHLORIDE CRYS ER 20 MEQ PO TBCR
20.0000 meq | EXTENDED_RELEASE_TABLET | Freq: Every day | ORAL | Status: DC
  Administered 2018-09-10: 20 meq via ORAL
  Filled 2018-09-09: qty 1

## 2018-09-09 MED ORDER — CLONIDINE HCL 0.2 MG PO TABS
0.2000 mg | ORAL_TABLET | Freq: Two times a day (BID) | ORAL | Status: DC
  Administered 2018-09-09 – 2018-09-10 (×2): 0.2 mg via ORAL
  Filled 2018-09-09 (×2): qty 1

## 2018-09-09 MED ORDER — ALBUTEROL SULFATE (2.5 MG/3ML) 0.083% IN NEBU
INHALATION_SOLUTION | RESPIRATORY_TRACT | Status: AC
  Administered 2018-09-09: 2.5 mg via RESPIRATORY_TRACT
  Filled 2018-09-09: qty 3

## 2018-09-09 MED ORDER — HYOSCYAMINE SULFATE 0.125 MG SL SUBL
0.1250 mg | SUBLINGUAL_TABLET | SUBLINGUAL | Status: DC | PRN
  Filled 2018-09-09: qty 1

## 2018-09-09 MED ORDER — HYDROMORPHONE HCL 1 MG/ML IJ SOLN
0.5000 mg | INTRAMUSCULAR | Status: DC | PRN
  Filled 2018-09-09: qty 1

## 2018-09-09 MED ORDER — DEXTROSE-NACL 5-0.45 % IV SOLN
INTRAVENOUS | Status: DC
  Administered 2018-09-09 – 2018-09-10 (×2): via INTRAVENOUS

## 2018-09-09 MED ORDER — FENTANYL CITRATE (PF) 250 MCG/5ML IJ SOLN
INTRAMUSCULAR | Status: AC
  Filled 2018-09-09: qty 5

## 2018-09-09 MED ORDER — SODIUM CHLORIDE 0.9 % IR SOLN
3000.0000 mL | Status: DC
  Administered 2018-09-09 – 2018-09-10 (×3): 3000 mL

## 2018-09-09 MED ORDER — ONDANSETRON HCL 4 MG/2ML IJ SOLN
INTRAMUSCULAR | Status: DC | PRN
  Administered 2018-09-09: 4 mg via INTRAVENOUS

## 2018-09-09 MED ORDER — LACTATED RINGERS IV SOLN
INTRAVENOUS | Status: DC
  Administered 2018-09-09: 13:00:00 via INTRAVENOUS

## 2018-09-09 MED ORDER — PHENYLEPHRINE 40 MCG/ML (10ML) SYRINGE FOR IV PUSH (FOR BLOOD PRESSURE SUPPORT)
PREFILLED_SYRINGE | INTRAVENOUS | Status: DC | PRN
  Administered 2018-09-09 (×4): 100 ug via INTRAVENOUS
  Administered 2018-09-09: 120 ug via INTRAVENOUS
  Administered 2018-09-09: 100 ug via INTRAVENOUS

## 2018-09-09 MED ORDER — OXYCODONE HCL 5 MG PO TABS
5.0000 mg | ORAL_TABLET | ORAL | Status: DC | PRN
  Filled 2018-09-09: qty 1

## 2018-09-09 MED ORDER — POTASSIUM CHLORIDE ER 20 MEQ PO TBCR: 20.0000 meq | EXTENDED_RELEASE_TABLET | Freq: Every day | ORAL | Status: DC

## 2018-09-09 MED ORDER — OXYCODONE HCL 5 MG PO TABS: 5.0000 mg | ORAL_TABLET | Freq: Once | ORAL | Status: DC | PRN

## 2018-09-09 MED ORDER — ESMOLOL HCL 100 MG/10ML IV SOLN
INTRAVENOUS | Status: DC | PRN
  Administered 2018-09-09 (×2): 20 mg via INTRAVENOUS
  Administered 2018-09-09: 10 mg via INTRAVENOUS

## 2018-09-09 MED ORDER — OXYCODONE HCL 5 MG/5ML PO SOLN: 5.0000 mg | Freq: Once | ORAL | Status: DC | PRN

## 2018-09-09 MED ORDER — FLEET ENEMA 7-19 GM/118ML RE ENEM: 1.0000 | ENEMA | Freq: Once | RECTAL | Status: DC | PRN

## 2018-09-09 MED ORDER — FUROSEMIDE 40 MG PO TABS
40.0000 mg | ORAL_TABLET | Freq: Every day | ORAL | Status: DC
  Administered 2018-09-10: 40 mg via ORAL
  Filled 2018-09-09: qty 1

## 2018-09-09 MED ORDER — SODIUM CHLORIDE 0.9 % IV SOLN
INTRAVENOUS | Status: DC | PRN
  Administered 2018-09-09: 50 ug/min via INTRAVENOUS

## 2018-09-09 MED ORDER — PROPOFOL 10 MG/ML IV BOLUS
INTRAVENOUS | Status: DC | PRN
  Administered 2018-09-09: 200 mg via INTRAVENOUS

## 2018-09-09 MED ORDER — SUCCINYLCHOLINE CHLORIDE 20 MG/ML IJ SOLN
INTRAMUSCULAR | Status: DC | PRN
  Administered 2018-09-09: 140 mg via INTRAVENOUS

## 2018-09-09 MED ORDER — INSULIN ASPART 100 UNIT/ML ~~LOC~~ SOLN
0.0000 [IU] | Freq: Three times a day (TID) | SUBCUTANEOUS | Status: DC
  Administered 2018-09-09: 4 [IU] via SUBCUTANEOUS
  Administered 2018-09-10: 20 [IU] via SUBCUTANEOUS
  Administered 2018-09-10: 15 [IU] via SUBCUTANEOUS

## 2018-09-09 MED ORDER — METFORMIN HCL 500 MG PO TABS
500.0000 mg | ORAL_TABLET | Freq: Two times a day (BID) | ORAL | Status: DC
  Administered 2018-09-09 – 2018-09-10 (×3): 500 mg via ORAL
  Filled 2018-09-09 (×3): qty 1

## 2018-09-09 MED ORDER — MIDAZOLAM HCL 2 MG/2ML IJ SOLN
INTRAMUSCULAR | Status: AC
  Filled 2018-09-09: qty 2

## 2018-09-09 MED ORDER — 0.9 % SODIUM CHLORIDE (POUR BTL) OPTIME
TOPICAL | Status: DC | PRN
  Administered 2018-09-09: 1000 mL

## 2018-09-09 MED ORDER — FENTANYL CITRATE (PF) 100 MCG/2ML IJ SOLN: 25.0000 ug | INTRAMUSCULAR | Status: DC | PRN

## 2018-09-09 MED ORDER — SENNOSIDES-DOCUSATE SODIUM 8.6-50 MG PO TABS: 1.0000 | ORAL_TABLET | Freq: Every evening | ORAL | Status: DC | PRN

## 2018-09-09 MED ORDER — LIDOCAINE 2% (20 MG/ML) 5 ML SYRINGE
INTRAMUSCULAR | Status: DC | PRN
  Administered 2018-09-09: 100 mg via INTRAVENOUS

## 2018-09-09 MED ORDER — SUGAMMADEX SODIUM 500 MG/5ML IV SOLN
INTRAVENOUS | Status: DC | PRN
  Administered 2018-09-09: 300 mg via INTRAVENOUS

## 2018-09-09 MED ORDER — GLIPIZIDE 10 MG PO TABS
10.0000 mg | ORAL_TABLET | Freq: Every day | ORAL | Status: DC
  Administered 2018-09-10: 10 mg via ORAL
  Filled 2018-09-09: qty 1

## 2018-09-09 MED ORDER — ONDANSETRON HCL 4 MG/2ML IJ SOLN: 4.0000 mg | Freq: Once | INTRAMUSCULAR | Status: DC | PRN

## 2018-09-09 MED ORDER — ONDANSETRON HCL 4 MG/2ML IJ SOLN: 4.0000 mg | INTRAMUSCULAR | Status: DC | PRN

## 2018-09-09 MED ORDER — DEXTROSE IN LACTATED RINGERS 5 % IV SOLN
INTRAVENOUS | Status: DC
  Administered 2018-09-09 (×2): via INTRAVENOUS

## 2018-09-09 MED ORDER — BISACODYL 10 MG RE SUPP: 10.0000 mg | Freq: Every day | RECTAL | Status: DC | PRN

## 2018-09-09 MED ORDER — SODIUM CHLORIDE 0.9 % IR SOLN
Status: DC | PRN
  Administered 2018-09-09: 18000 mL

## 2018-09-09 MED ORDER — DEXTROSE 50 % IV SOLN
INTRAVENOUS | Status: AC
  Administered 2018-09-09: 12.5 g via INTRAVENOUS
  Filled 2018-09-09: qty 50

## 2018-09-09 MED ORDER — ACETAMINOPHEN 325 MG PO TABS: 650.0000 mg | ORAL_TABLET | ORAL | Status: DC | PRN

## 2018-09-09 MED ORDER — INSULIN GLARGINE 100 UNIT/ML ~~LOC~~ SOLN
80.0000 [IU] | Freq: Every day | SUBCUTANEOUS | Status: DC
  Administered 2018-09-09: 80 [IU] via SUBCUTANEOUS
  Filled 2018-09-09 (×2): qty 0.8

## 2018-09-09 MED ORDER — HYDROXYZINE HCL 50 MG PO TABS: 50.0000 mg | ORAL_TABLET | Freq: Every evening | ORAL | Status: DC | PRN

## 2018-09-09 MED ORDER — CEPHALEXIN 500 MG PO CAPS
500.0000 mg | ORAL_CAPSULE | Freq: Three times a day (TID) | ORAL | Status: DC
  Administered 2018-09-09 – 2018-09-10 (×4): 500 mg via ORAL
  Filled 2018-09-09 (×4): qty 1

## 2018-09-09 MED ORDER — PROPOFOL 10 MG/ML IV BOLUS
INTRAVENOUS | Status: AC
  Filled 2018-09-09: qty 40

## 2018-09-09 MED ORDER — ALBUTEROL SULFATE (2.5 MG/3ML) 0.083% IN NEBU
2.5000 mg | INHALATION_SOLUTION | Freq: Four times a day (QID) | RESPIRATORY_TRACT | Status: DC | PRN
  Administered 2018-09-09: 2.5 mg via RESPIRATORY_TRACT

## 2018-09-09 MED ORDER — DEXAMETHASONE SODIUM PHOSPHATE 10 MG/ML IJ SOLN
INTRAMUSCULAR | Status: DC | PRN
  Administered 2018-09-09: 10 mg via INTRAVENOUS

## 2018-09-09 SURGICAL SUPPLY — 17 items
BAG URINE DRAINAGE (UROLOGICAL SUPPLIES) ×3 IMPLANT
BAG URO CATCHER STRL LF (MISCELLANEOUS) ×3 IMPLANT
CATH FOLEY 3WAY 30CC 22FR (CATHETERS) ×3 IMPLANT
COVER WAND RF STERILE (DRAPES) IMPLANT
ELECT REM PT RETURN 15FT ADLT (MISCELLANEOUS) IMPLANT
GLOVE SURG SS PI 8.0 STRL IVOR (GLOVE) ×3 IMPLANT
GOWN STRL REUS W/TWL XL LVL3 (GOWN DISPOSABLE) ×6 IMPLANT
HOLDER FOLEY CATH W/STRAP (MISCELLANEOUS) ×3 IMPLANT
LOOP CUT BIPOLAR 24F LRG (ELECTROSURGICAL) ×3 IMPLANT
MANIFOLD NEPTUNE II (INSTRUMENTS) ×3 IMPLANT
PACK CYSTO (CUSTOM PROCEDURE TRAY) ×3 IMPLANT
SET ASPIRATION TUBING (TUBING) IMPLANT
SYR 30ML LL (SYRINGE) ×3 IMPLANT
SYRINGE IRR TOOMEY STRL 70CC (SYRINGE) ×3 IMPLANT
TUBING CONNECTING 10 (TUBING) ×2 IMPLANT
TUBING CONNECTING 10' (TUBING) ×1
TUBING UROLOGY SET (TUBING) ×3 IMPLANT

## 2018-09-09 NOTE — Op Note (Signed)
Preoperative diagnosis: 1. Bladder outlet obstruction secondary to BPH  Postoperative diagnosis:  1. Bladder outlet obstruction secondary to BPH  Procedure:  1. Cystoscopy 2. Transurethral Resection of the prostate  Surgeon: Bjorn Pippin. M.D.  Anesthesia: general  Complications: None  EBL: 50ml  Specimens: 1. TUR chips  Disposition of specimens: Pathology  Indication: Jeff Garcia is a patient with bladder outlet obstruction secondary to benign prostatic hyperplasia. After reviewing the management options for treatment, he elected to proceed with the above surgical procedure(s). We have discussed the potential benefits and risks of the procedure, side effects of the proposed treatment, the likelihood of the patient achieving the goals of the procedure, and any potential problems that might occur during the procedure or recuperation. Informed consent has been obtained.  Description of procedure:  The patient was taken to the operating room and general  anesthesia was induced.  The patient was placed in the dorsal lithotomy position, prepped with chlorhexidine and draped in the usual sterile fashion, and preoperative antibiotics were administered. A preoperative time-out was performed.   Cystourethroscopy was performed.  The patient's urethra was examined and was normal.  He had trilobar hyperplasia with a large obstructing middle lobe The bladder was then systematically examined in its entirety. There was moderate trabeculation with no evidence of any bladder tumors, stones, or other mucosal pathology.  The ureteral orifices were identified and marked so as to be avoided during the procedure.  The prostate adenoma was then resected utilizing loop cautery resection with the bipolar cutting loop.  The middle lobe was initiall resected down to the bladder neck fibers and then the prostate adenoma from the bladder neck back to the verumontanum was resected beginning at the six o'clock  position and then extended to include the right and left lobes of the prostate and anterior prostate. Care was taken not to resect distal to the verumontanum.  At the completion of the procedure the bladder was evacuated free of chips and hemostasis was insured.  Final inspection revealed intact ureteral orifices, a widely patent TUR channel and an intact external sphincter.   Hemostasis was then achieved with the cautery and the bladder was emptied and reinspected with no significant bleeding noted at the end of the procedure.    A 83fr 3 way catheter was then placed into the bladder and placed on continuous bladder irrigation.  The patient appeared to tolerate the procedure well and without complications.  The patient was able to be awakened and transferred to the recovery unit in satisfactory condition.

## 2018-09-09 NOTE — Progress Notes (Signed)
Rechecked CBG . Post administration of 12.5gms of Dextrose 50%, reported result of 81mg /dl to Dr. Darlin Drop. Started IV infusion of D5LR at 100cc/hr. Patient remains somnolent but arouses easily and is alert/oriiented with stimulation.

## 2018-09-09 NOTE — Discharge Instructions (Signed)
Transurethral Resection of the Prostate, Care After °Refer to this sheet in the next few weeks. These instructions provide you with information about caring for yourself after your procedure. Your health care provider may also give you more specific instructions. Your treatment has been planned according to current medical practices, but problems sometimes occur. Call your health care provider if you have any problems or questions after your procedure. °What can I expect after the procedure? °After the procedure, it is common to have: °· Mild pain in your lower abdomen. °· Soreness or mild discomfort in your penis from having the catheter inserted during the procedure. °· A feeling of urgency when you need to urinate. °· A small amount of blood in your urine. You may notice some small blood clots in your urine. These are normal. ° °Follow these instructions at home: °Medicines ° °· Take over-the-counter and prescription medicines only as told by your health care provider. °· Do not drive or operate heavy machinery while taking prescription pain medicine. °· Do not drive for 24 hours if you received a sedative. °· If you were prescribed antibiotic medicine, take it as told by your health care provider. Do not stop taking the antibiotic even if you start to feel better. °Activity °· Return to your normal activities as told by your health care provider. Ask your health care provider what activities are safe for you. °· Do not lift anything that is heavier than 10 lb (4.5 kg) for 3 weeks after your procedure, or as long as told by your health care provider. °· Avoid intense physical activity for as long as told by your health care provider. °· Walk at least one time every day. This helps to prevent blood clots. You may increase your physical activity gradually as you start to feel better. °Lifestyle °· Do not drink alcohol for as long as told by your health care provider. This is especially important if you are taking  prescription pain medicines. °· Do not engage in sexual activity until your health care provider says that you can do this. °General instructions °· Do not take baths, swim, or use a hot tub until your health care provider approves. °· Drink enough fluid to keep your urine clear or pale yellow. °· Urinate as soon as you feel the need to. Do not try to hold your urine for long periods of time. °· If your health care provider approves, you may take a stool softener for 2-3 weeks to prevent you from straining to have a bowel movement. °· Wear compression stockings as told by your health care provider. These stockings help to prevent blood clots and reduce swelling in your legs. °· Keep all follow-up visits as told by your health care provider. This is important. °Contact a health care provider if: °· You have difficulty urinating. °· You have a fever. °· You have pain that gets worse or does not improve with medicine. °· You have blood in your urine that does not go away after 1 week of resting and drinking more fluids. °· You have swelling in your penis or testicles. °Get help right away if: °· You are unable to urinate. °· You are having more blood clots in your urine instead of fewer. °· You have: °? Large blood clots. °? A lot of blood in your urine. °? Pain in your back or lower abdomen. °? Pain or swelling in your legs. °? Chills and you are shaking. °This information is not intended to   replace advice given to you by your health care provider. Make sure you discuss any questions you have with your health care provider. °Document Released: 11/12/2005 Document Revised: 07/15/2016 Document Reviewed: 08/04/2015 °Elsevier Interactive Patient Education © 2017 [employer] ° °

## 2018-09-09 NOTE — Anesthesia Procedure Notes (Signed)
Procedure Name: Intubation Date/Time: 09/09/2018 11:52 AM Performed by: Pilar Grammes, CRNA Pre-anesthesia Checklist: Patient identified, Emergency Drugs available, Suction available, Patient being monitored and Timeout performed Patient Re-evaluated:Patient Re-evaluated prior to induction Oxygen Delivery Method: Circle system utilized Preoxygenation: Pre-oxygenation with 100% oxygen Induction Type: IV induction Ventilation: Mask ventilation without difficulty Laryngoscope Size: Mac and 4 Grade View: Grade III Tube type: Oral Tube size: 7.5 mm Number of attempts: 1 Airway Equipment and Method: Stylet and Patient positioned with wedge pillow Placement Confirmation: positive ETCO2,  ETT inserted through vocal cords under direct vision,  CO2 detector and breath sounds checked- equal and bilateral Secured at: 26 cm Tube secured with: Tape Dental Injury: Teeth and Oropharynx as per pre-operative assessment  Difficulty Due To: Difficulty was anticipated

## 2018-09-09 NOTE — Progress Notes (Signed)
Upon ewview of orders, Albuterol order was from Dr Armond Hang.

## 2018-09-09 NOTE — Interval H&P Note (Signed)
History and Physical Interval Note:  He is hypoglycemic this morning after taking his full insulin dose.  He is being treated with D50 and we will proceed when cleared by anesthesia.   09/09/2018 11:10 AM  Jeff Garcia  has presented today for surgery, with the diagnosis of BENIGN PROSTATE HYPERPLASIA WITH BLADDER OUTLET OBSTRUCTION  The various methods of treatment have been discussed with the patient and family. After consideration of risks, benefits and other options for treatment, the patient has consented to  Procedure(s): TRANSURETHRAL RESECTION OF THE PROSTATE (TURP) (N/A) as a surgical intervention .  The patient's history has been reviewed, patient examined, no change in status, stable for surgery.  I have reviewed the patient's chart and labs.  Questions were answered to the patient's satisfaction.     Bjorn Pippin

## 2018-09-09 NOTE — Transfer of Care (Signed)
Immediate Anesthesia Transfer of Care Note  Patient: Jeff Garcia  Procedure(s) Performed: TRANSURETHRAL RESECTION OF THE PROSTATE (TURP) (N/A )  Patient Location: PACU  Anesthesia Type:General  Level of Consciousness: awake, sedated and patient cooperative  Airway & Oxygen Therapy: Patient Spontanous Breathing and Patient connected to face mask oxygen  Post-op Assessment: Report given to RN, Post -op Vital signs reviewed and stable and Patient moving all extremities X 4  Post vital signs: stable  Last Vitals:  Vitals Value Taken Time  BP 157/97 09/09/2018  1:08 PM  Temp    Pulse 88 09/09/2018  1:13 PM  Resp 19 09/09/2018  1:13 PM  SpO2 100 % 09/09/2018  1:13 PM  Vitals shown include unvalidated device data.  Last Pain:  Vitals:   09/09/18 0947  TempSrc: Oral         Complications: No apparent anesthesia complications

## 2018-09-09 NOTE — Anesthesia Preprocedure Evaluation (Signed)
Anesthesia Evaluation  Patient identified by MRN, date of birth, ID bandGeneral Assessment Comment:Patient somewhat somnolent but arousable. Able to hold a conversation and answer questions.  Reviewed: Allergy & Precautions, NPO status , Patient's Chart, lab work & pertinent test results  History of Anesthesia Complications Negative for: history of anesthetic complications  Airway Mallampati: II  TM Distance: >3 FB Neck ROM: Full    Dental no notable dental hx.    Pulmonary asthma , Current Smoker,    Pulmonary exam normal        Cardiovascular hypertension, Normal cardiovascular exam     Neuro/Psych CVA (possible CVA 15 years ago, per patient workup was all negative, no residual symptoms), No Residual Symptoms negative psych ROS   GI/Hepatic negative GI ROS, Neg liver ROS,   Endo/Other  diabetes, Poorly Controlled, Insulin Dependent, Oral Hypoglycemic AgentsMorbid obesity  Renal/GU Renal InsufficiencyRenal disease  negative genitourinary   Musculoskeletal negative musculoskeletal ROS (+)   Abdominal (+) + obese,   Peds  Hematology negative hematology ROS (+)   Anesthesia Other Findings   Reproductive/Obstetrics                             Anesthesia Physical Anesthesia Plan  ASA: III  Anesthesia Plan: General   Post-op Pain Management:    Induction: Intravenous  PONV Risk Score and Plan: 2 and Ondansetron, Treatment may vary due to age or medical condition and Dexamethasone  Airway Management Planned: Oral ETT  Additional Equipment: None  Intra-op Plan:   Post-operative Plan: Extubation in OR  Informed Consent: I have reviewed the patients History and Physical, chart, labs and discussed the procedure including the risks, benefits and alternatives for the proposed anesthesia with the patient or authorized representative who has indicated his/her understanding and acceptance.      Plan Discussed with:   Anesthesia Plan Comments:         Anesthesia Quick Evaluation

## 2018-09-09 NOTE — Progress Notes (Signed)
CBg repeated, result of 55mg /dl reported to Dr. Darlin Drop.  Additional 12.5gm of Dextrose 50% given IV. D5LR infusion increased to 200cc/hr.  Neurological status unchanged.

## 2018-09-09 NOTE — Progress Notes (Signed)
CBG rechecked. Result of 93mg /dl reported to Dr.s Annabell Howells and Darlin Drop.  Dr. Darlin Drop back in to check patient and approval given to proceed with surgery.  OR notified.

## 2018-09-09 NOTE — Plan of Care (Signed)
  Problem: Clinical Measurements: Goal: Postoperative complications will be avoided or minimized Outcome: Progressing   Problem: Education: Goal: Knowledge of General Education information will improve Description Including pain rating scale, medication(s)/side effects and non-pharmacologic comfort measures Outcome: Progressing   Problem: Health Behavior/Discharge Planning: Goal: Ability to manage health-related needs will improve Outcome: Progressing   Problem: Clinical Measurements: Goal: Ability to maintain clinical measurements within normal limits will improve Outcome: Progressing Goal: Will remain free from infection Outcome: Progressing Goal: Diagnostic test results will improve Outcome: Progressing Goal: Respiratory complications will improve Outcome: Progressing Goal: Cardiovascular complication will be avoided Outcome: Progressing   Problem: Activity: Goal: Risk for activity intolerance will decrease Outcome: Progressing   Problem: Nutrition: Goal: Adequate nutrition will be maintained Outcome: Progressing   Problem: Coping: Goal: Level of anxiety will decrease Outcome: Progressing   Problem: Pain Managment: Goal: General experience of comfort will improve Outcome: Progressing   Problem: Safety: Goal: Ability to remain free from injury will improve Outcome: Progressing   Problem: Skin Integrity: Goal: Risk for impaired skin integrity will decrease Outcome: Progressing

## 2018-09-10 ENCOUNTER — Encounter (HOSPITAL_COMMUNITY): Payer: Self-pay | Admitting: Urology

## 2018-09-10 DIAGNOSIS — N401 Enlarged prostate with lower urinary tract symptoms: Secondary | ICD-10-CM | POA: Diagnosis not present

## 2018-09-10 LAB — BASIC METABOLIC PANEL
Anion gap: 11 (ref 5–15)
BUN: 29 mg/dL — ABNORMAL HIGH (ref 8–23)
CHLORIDE: 101 mmol/L (ref 98–111)
CO2: 26 mmol/L (ref 22–32)
CREATININE: 1.49 mg/dL — AB (ref 0.61–1.24)
Calcium: 8.5 mg/dL — ABNORMAL LOW (ref 8.9–10.3)
GFR calc Af Amer: 52 mL/min — ABNORMAL LOW (ref >60)
GFR, EST NON AFRICAN AMERICAN: 44 mL/min — AB (ref >60)
GLUCOSE: 430 mg/dL — AB (ref 70–99)
POTASSIUM: 5 mmol/L (ref 3.5–5.1)
Sodium: 138 mmol/L (ref 135–145)

## 2018-09-10 LAB — GLUCOSE, CAPILLARY
Glucose-Capillary: 381 mg/dL — ABNORMAL HIGH (ref 70–99)
Glucose-Capillary: 318 mg/dL — ABNORMAL HIGH (ref 70–99)
Glucose-Capillary: 137 mg/dL — ABNORMAL HIGH (ref 70–99)

## 2018-09-10 NOTE — Anesthesia Postprocedure Evaluation (Signed)
Anesthesia Post Note  Patient: Jeff Garcia  Procedure(s) Performed: TRANSURETHRAL RESECTION OF THE PROSTATE (TURP) (N/A )     Patient location during evaluation: PACU Anesthesia Type: General Level of consciousness: awake and alert Pain management: pain level controlled Vital Signs Assessment: post-procedure vital signs reviewed and stable Respiratory status: spontaneous breathing, nonlabored ventilation, respiratory function stable and patient connected to nasal cannula oxygen Cardiovascular status: blood pressure returned to baseline and stable Postop Assessment: no apparent nausea or vomiting Anesthetic complications: no    Last Vitals:  Vitals:   09/10/18 0830 09/10/18 1418  BP:  139/67  Pulse:  92  Resp:  20  Temp:  36.8 C  SpO2: 97% 96%    Last Pain:  Vitals:   09/10/18 1418  TempSrc: Oral  PainSc:                  Kyuss Hale S

## 2018-09-10 NOTE — Progress Notes (Signed)
Pt. Foley removed, has urinated into 2 of 3 bottles so far. Urine light pink.

## 2018-09-10 NOTE — Discharge Summary (Signed)
Physician Discharge Summary  Patient ID: Jeff Garcia MRN: [id number] DOB/AGE: May 24, 1946 72 y.o.  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 Asthma   . BPH (benign prostatic hyperplasia)   . COPD (chronic obstructive pulmonary disease) (HCC)   . Diabetes mellitus without complication (HCC)   . Hyperlipidemia   . Hypertension   . Neuropathy   . Stroke [facility])     Surgeries: Procedure(s): TRANSURETHRAL RESECTION OF THE PROSTATE (TURP) on 09/09/2018   Consultants (if any):   Discharged Condition: Improved  Hospital Course: Jeff Garcia is an 72 y.o. male who was admitted 09/09/2018 with a diagnosis of BPH with urinary obstruction and went to the operating room on 09/09/2018 and underwent the above named procedures.  He had to have hypoglycemia managed preop and postop had hyperglycemia but that is improving.  He has been voiding well since foley removal with minor hematuria.   He is ok for discharge.   He was given perioperative antibiotics:  Anti-infectives (From admission, onward)   Start     Dose/Rate Route Frequency Ordered Stop   09/09/18 1600  cephALEXin (KEFLEX) capsule 500 mg     500 mg Oral 3 times daily 09/09/18 1504     09/09/18 0600  ceFAZolin (ANCEF) 3 g in dextrose 5 % 50 mL IVPB     3 g 100 mL/hr over 30 Minutes Intravenous On call to O.R. 09/08/18 1356 09/09/18 1158    .  He was given sequential compression devices for DVT prophylaxis.  He benefited maximally from the hospital stay and there were no complications.    Recent vital signs:  Vitals:   09/09/18 2120 09/10/18 0608  BP: (!) 147/70 (!) 153/92  Pulse: 96 99  Resp: 16 16  Temp: 98.2 F (36.8 C)  (!) 97.3 F (36.3 C)  SpO2: 99% 97%    Recent laboratory studies:  Lab Results  Component Value Date   HGB 15.8 09/05/2018   Lab Results  Component Value Date   WBC 6.5 09/05/2018   PLT 222 09/05/2018   No results found for: INR Lab Results  Component Value Date   NA 138 09/10/2018   K 5.0 09/10/2018   CL 101 09/10/2018   CO2 26 09/10/2018   BUN 29 (H) 09/10/2018   CREATININE 1.49 (H) 09/10/2018   GLUCOSE 430 (H) 09/10/2018    Discharge Medications:   Allergies as of 09/10/2018      Reactions   Shellfish Allergy    Pt vomits      Medication List    TAKE these medications   aspirin EC 81 MG tablet Take 81 mg by mouth daily.   cloNIDine 0.2 MG tablet Commonly known as:  CATAPRES TAKE 1 TABLET BY MOUTH TWICE DAILY   furosemide 40 MG tablet Commonly known as:  LASIX Take 40 mg by mouth daily.   glipiZIDE 10 MG tablet Commonly known as:  GLUCOTROL Take 10 mg by mouth daily.   hydrOXYzine 50 MG tablet Commonly known as:  ATARAX/VISTARIL Take 50 mg by mouth at bedtime as needed for anxiety.   LANTUS SOLOSTAR 100 UNIT/ML Solostar Pen Generic drug:  Insulin Glargine Inject 80 Units into the skin at bedtime.   metFORMIN 500 MG  tablet Commonly known as:  GLUCOPHAGE Take 1 tablet (500 mg total) by mouth 2 (two) times daily with a meal.   NOVOLOG FLEXPEN 100 UNIT/ML FlexPen Generic drug:  insulin aspart Inject 25-31 Units into the skin 3 (three) times daily with meals. Per sliding scale   Potassium Chloride ER 20 MEQ Tbcr Take 20 mEq by mouth daily.   PROSTATE HEALTH PO Take 2 capsules by mouth daily.       Diagnostic Studies: Dg Chest 2 View  Result Date: 09/05/2018 CLINICAL DATA:  Preop prostate cancer see placement EXAM: CHEST - 2 VIEW COMPARISON:  01/08/2018 FINDINGS: Heart and mediastinal contours are within normal limits. No focal opacities or effusions. No acute bony abnormality. IMPRESSION: No active cardiopulmonary disease. Electronically  Signed   By: Charlett Nose M.D.   On: 09/05/2018 16:47    Disposition: Discharge disposition: 01-Home or Self Care       Discharge Instructions    Discontinue IV   Complete by:  As directed       Follow-up Information    Bjorn Pippin, MD On 10/13/2018.   Specialty:  Urology Why:  [id number] Contact information: [address] [place] [address] [phone]            Signed: Bjorn Pippin 09/10/2018, 12:22 PM

## 2018-09-10 NOTE — Progress Notes (Signed)
SATURATION QUALIFICATIONS: (This note is used to comply with regulatory documentation for home oxygen)  Patient Saturations on Room Air at Rest = 97%  Patient Saturations on Room Air while Ambulating = 93%  Please briefly explain why patient needs home oxygen: - successfully weaned off of oxygen

## 2018-09-10 NOTE — Progress Notes (Signed)
1 Day Post-Op  Subjective: He is doing well POD#1 from a TURP.   His urine is light pink on slow CBI.   His Glucose is 430.  He is without significant complaints.  ROS:  Review of Systems  All other systems reviewed and are negative.   Anti-infectives: Anti-infectives (From admission, onward)   Start     Dose/Rate Route Frequency Ordered Stop   09/09/18 1600  cephALEXin (KEFLEX) capsule 500 mg     500 mg Oral 3 times daily 09/09/18 1504     09/09/18 0600  ceFAZolin (ANCEF) 3 g in dextrose 5 % 50 mL IVPB     3 g 100 mL/hr over 30 Minutes Intravenous On call to O.R. 09/08/18 1356 09/09/18 1158      Current Facility-Administered Medications  Medication Dose Route Frequency Provider Last Rate Last Dose  . acetaminophen (TYLENOL) tablet 650 mg  650 mg Oral Q4H PRN Bjorn Pippin, MD      . bisacodyl (DULCOLAX) suppository 10 mg  10 mg Rectal Daily PRN Bjorn Pippin, MD      . cephALEXin [facility]) capsule 500 mg  500 mg Oral TID Bjorn Pippin, MD   500 mg at 09/09/18 2121  . cloNIDine (CATAPRES) tablet 0.2 mg  0.2 mg Oral BID Bjorn Pippin, MD   0.2 mg at 09/09/18 2121  . dextrose 5 %-0.45 % sodium chloride infusion   Intravenous Continuous Bjorn Pippin, MD 100 mL/hr at 09/10/18 0440    . furosemide (LASIX) tablet 40 mg  40 mg Oral Daily Bjorn Pippin, MD      . glipiZIDE (GLUCOTROL) tablet 10 mg  10 mg Oral Daily Bjorn Pippin, MD      . HYDROmorphone (DILAUDID) injection 0.5-1 mg  0.5-1 mg Intravenous Q2H PRN Bjorn Pippin, MD      . hydrOXYzine (ATARAX/VISTARIL) tablet 50 mg  50 mg Oral QHS PRN Bjorn Pippin, MD      . hyoscyamine (LEVSIN SL) SL tablet 0.125 mg  0.125 mg Sublingual Q4H PRN Bjorn Pippin, MD      . insulin aspart (novoLOG) injection 0-20 Units  0-20 Units Subcutaneous TID WC Bjorn Pippin, MD   4 Units at 09/09/18 1751  . insulin glargine (LANTUS) injection 80 Units  80 Units Subcutaneous QHS Bjorn Pippin, MD   80 Units at 09/09/18 2121  . metFORMIN (GLUCOPHAGE) tablet 500 mg  500 mg Oral BID  WC Bjorn Pippin, MD   500 mg at 09/09/18 1749  . ondansetron (ZOFRAN) injection 4 mg  4 mg Intravenous Q4H PRN Bjorn Pippin, MD      . oxyCODONE (Oxy IR/ROXICODONE) immediate release tablet 5 mg  5 mg Oral Q4H PRN Bjorn Pippin, MD      . potassium chloride SA (K-DUR,KLOR-CON) CR tablet 20 mEq  20 mEq Oral Daily Bjorn Pippin, MD      . senna-docusate (Senokot-S) tablet 1 tablet  1 tablet Oral QHS PRN Bjorn Pippin, MD      . sodium chloride irrigation 0.9 % 3,000 mL  3,000 mL Irrigation Continuous Bjorn Pippin, MD   3,000 mL at 09/10/18 0150  . sodium phosphate (FLEET) 7-19 GM/118ML enema 1 enema  1 enema Rectal Once PRN Bjorn Pippin, MD         Objective: Vital signs in last 24 hours: Temp:  [97.3 F (36.3 C)-98.9 F (37.2 C)] 97.3 F (36.3 C) (10/16 0608) Pulse Rate:  [89-99] 99 (10/16 0608) Resp:  [16-27] 16 (10/16 0608) BP: (138-172)/(70-97) 153/92 (10/16 5784)  SpO2:  [91 %-100 %] 97 % (10/16 0981) Weight:  [139 kg] 139 kg (10/15 0947)  Intake/Output from previous day: 10/15 0701 - 10/16 0700 In: 9100 [I.V.:2800] Out: 8700 [Urine:8600; Blood:100] Intake/Output this shift: No intake/output data recorded.   Physical Exam  Constitutional:  Obese, WD in NAD  Cardiovascular: Normal rate and regular rhythm.  Pulmonary/Chest: Effort normal. No respiratory distress.  Vitals reviewed.   Lab Results:  No results for input(s): WBC, HGB, HCT, PLT in the last 72 hours. BMET Recent Labs    09/09/18 0923 09/10/18 0428  NA 144 138  K 4.3 5.0  CL 106 101  CO2 28 26  GLUCOSE 46*  48* 430*  BUN 26* 29*  CREATININE 1.42* 1.49*  CALCIUM 9.6 8.5*   PT/INR No results for input(s): LABPROT, INR in the last 72 hours. ABG No results for input(s): PHART, HCO3 in the last 72 hours.  Invalid input(s): PCO2, PO2  Studies/Results: No results found.   Assessment and Plan: BPH with BOO doing well post TURP.  Urine is very light pink on slow CBI.  I will remove the foley and reassess  later today.  Diabetes with hyperglycemia.   I will Heplock IV which has D5 and will monitor BS.        LOS: 0 days    Bjorn Pippin 09/10/2018 [phone] ID: Janice Coffin, male   DOB: 1946/11/02, 72 y.o.   MRN: [id number]

## 2018-10-03 ENCOUNTER — Other Ambulatory Visit (HOSPITAL_COMMUNITY)
Admission: RE | Admit: 2018-10-03 | Discharge: 2018-10-03 | Disposition: A | Discharge status: Home / Self Care | Payer: Medicare HMO | Source: Other Acute Inpatient Hospital | Attending: Urology | Admitting: Urology

## 2018-10-03 ENCOUNTER — Ambulatory Visit (INDEPENDENT_AMBULATORY_CARE_PROVIDER_SITE_OTHER): Payer: Self-pay | Admitting: Urology

## 2018-10-03 DIAGNOSIS — N3281 Overactive bladder: Secondary | ICD-10-CM

## 2018-10-03 DIAGNOSIS — N3941 Urge incontinence: Secondary | ICD-10-CM

## 2018-10-03 DIAGNOSIS — N401 Enlarged prostate with lower urinary tract symptoms: Secondary | ICD-10-CM

## 2018-10-04 LAB — URINE CULTURE: Culture: NO GROWTH

## 2018-11-28 LAB — VITAMIN D 25 HYDROXY (VIT D DEFICIENCY, FRACTURES): Vit D, 25-Hydroxy: 21

## 2018-12-01 ENCOUNTER — Ambulatory Visit (INDEPENDENT_AMBULATORY_CARE_PROVIDER_SITE_OTHER): Payer: Medicare PPO | Admitting: "Endocrinology

## 2018-12-01 ENCOUNTER — Encounter: Payer: Self-pay | Admitting: "Endocrinology

## 2018-12-01 VITALS — BP 150/82 | HR 76 | Ht 67.5 in | Wt 319.0 lb

## 2018-12-01 DIAGNOSIS — N1831 Chronic kidney disease, stage 3a: Secondary | ICD-10-CM | POA: Insufficient documentation

## 2018-12-01 DIAGNOSIS — F17200 Nicotine dependence, unspecified, uncomplicated: Secondary | ICD-10-CM | POA: Diagnosis not present

## 2018-12-01 DIAGNOSIS — N183 Chronic kidney disease, stage 3 unspecified: Secondary | ICD-10-CM

## 2018-12-01 DIAGNOSIS — I10 Essential (primary) hypertension: Secondary | ICD-10-CM | POA: Diagnosis not present

## 2018-12-01 DIAGNOSIS — E1122 Type 2 diabetes mellitus with diabetic chronic kidney disease: Secondary | ICD-10-CM

## 2018-12-01 DIAGNOSIS — Z794 Long term (current) use of insulin: Secondary | ICD-10-CM

## 2018-12-01 MED ORDER — GLUCOSE BLOOD VI STRP: ORAL_STRIP | 2 refills | Status: DC

## 2018-12-01 MED ORDER — ACCU-CHEK GUIDE ME W/DEVICE KIT: 1.0000 | PACK | 0 refills | Status: AC

## 2018-12-01 NOTE — Patient Instructions (Signed)

## 2018-12-01 NOTE — Progress Notes (Signed)
Endocrinology follow-up note  Subjective:    Patient ID: Jeff Garcia, male    DOB: 1946/03/28. Patient is being seen in follow-up for management of chronically uncontrolled and complicated type 2 diabetes, hypertension, hyperlipidemia. PCP: Wyatt Haste, NP  Past Medical History:  Diagnosis Date  . Asthma   . BPH (benign prostatic hyperplasia)   . COPD (chronic obstructive pulmonary disease) ([place])   . Diabetes mellitus without complication ([place])   . Hyperlipidemia   . Hypertension   . Neuropathy   . Stroke [facility])    Past Surgical History:  Procedure Laterality Date  . BUNIONECTOMY  1993  . TRANSURETHRAL RESECTION OF PROSTATE N/A 09/09/2018   Procedure: TRANSURETHRAL RESECTION OF THE PROSTATE (TURP);  Surgeon: Irine Seal, MD;  Location: [facility];  Service: Urology;  Laterality: N/A;   Social History   Socioeconomic History  . Marital status: Divorced    Spouse name: Not on file  . Number of children: Not on file  . Years of education: Not on file  . Highest education level: Not on file  Occupational History  . Not on file  Social Needs  . Financial resource strain: Not on file  . Food insecurity:    Worry: Not on file    Inability: Not on file  . Transportation needs:    Medical: Not on file    Non-medical: Not on file  Tobacco Use  . Smoking status: Current Every Day Smoker    Packs/day: 1.00    Years: 50.00    Pack years: 50.00    Types: Cigarettes  . Smokeless tobacco: Never Used  Substance and Sexual Activity  . Alcohol use: Yes    Alcohol/week: 1.0 standard drinks    Types: 1 Cans of beer per week    Comment: rare  . Drug use: Yes    Types: Cocaine  . Sexual activity: Not on file  Lifestyle  . Physical activity:    Days per week: Not on file    Minutes per session: Not on file  . Stress: Not on file  Relationships  . Social connections:    Talks on phone: Not on file    Gets together: Not on file    Attends religious service: Not on file     Active member of club or organization: Not on file    Attends meetings of clubs or organizations: Not on file    Relationship status: Not on file  Other Topics Concern  . Not on file  Social History Narrative  . Not on file   Outpatient Encounter Medications as of 12/01/2018  Medication Sig  . aspirin EC 81 MG tablet Take 81 mg by mouth daily.  . Blood Glucose Monitoring Suppl (ACCU-CHEK GUIDE ME) w/Device KIT 1 Piece by Does not apply route as directed.  . cloNIDine (CATAPRES) 0.2 MG tablet TAKE 1 TABLET BY MOUTH TWICE DAILY (Patient taking differently: Take 0.2 mg by mouth 2 (two) times daily. )  . furosemide (LASIX) 40 MG tablet Take 40 mg by mouth daily.  Marland Kitchen glucose blood (ACCU-CHEK GUIDE) test strip Use as instructed  . hydrOXYzine (ATARAX/VISTARIL) 50 MG tablet Take 50 mg by mouth at bedtime as needed for anxiety.  . insulin aspart (NOVOLOG FLEXPEN) 100 UNIT/ML FlexPen Inject 25-31 Units into the skin 3 (three) times daily with meals. Per sliding scale  . Insulin Glargine (LANTUS SOLOSTAR) 100 UNIT/ML Solostar Pen Inject 80 Units into the skin at bedtime.   . metFORMIN (  GLUCOPHAGE) 500 MG tablet Take 1 tablet (500 mg total) by mouth 2 (two) times daily with a meal.  . Misc Natural Products (PROSTATE HEALTH PO) Take 2 capsules by mouth daily.  . Potassium Chloride ER 20 MEQ TBCR Take 20 mEq by mouth daily.   . [DISCONTINUED] glipiZIDE (GLUCOTROL) 10 MG tablet Take 10 mg by mouth daily.   No facility-administered encounter medications on file as of 12/01/2018.    ALLERGIES: Allergies  Allergen Reactions  . Shellfish Allergy     Pt vomits   VACCINATION STATUS:  There is no immunization history on file for this patient.  Diabetes  He presents for his follow-up diabetic visit. He has type 2 diabetes mellitus. Onset time: He was diagnosed at approximate age of 67 years. His disease course has been worsening. There are no hypoglycemic associated symptoms. Pertinent negatives for  hypoglycemia include no confusion, headaches, pallor or seizures. Associated symptoms include blurred vision, polydipsia and polyuria. Pertinent negatives for diabetes include no chest pain, no fatigue, no polyphagia and no weakness. There are no hypoglycemic complications. Symptoms are worsening. Diabetic complications include a CVA. Risk factors for coronary artery disease include diabetes mellitus, dyslipidemia, family history, hypertension, male sex, obesity, sedentary lifestyle and tobacco exposure. Current diabetic treatment includes insulin injections (He changed around the doses of NovoLog 70/30 taking up to 120 units at times despite my advice for him to take 60 units twice a day with breakfast and supper.). He is compliant with treatment some of the time. His weight is increasing steadily. He is following a generally unhealthy diet. He has not had a previous visit with a dietitian. He never participates in exercise. His home blood glucose trend is fluctuating minimally. His breakfast blood glucose range is generally 180-200 mg/dl. His lunch blood glucose range is generally >200 mg/dl. His dinner blood glucose range is generally >200 mg/dl. His bedtime blood glucose range is generally >200 mg/dl. His overall blood glucose range is >200 mg/dl. An ACE inhibitor/angiotensin II receptor blocker is being taken.  Hypertension  This is a chronic problem. The current episode started more than 1 year ago. The problem is uncontrolled. Associated symptoms include blurred vision. Pertinent negatives include no chest pain, headaches, neck pain or palpitations. Risk factors for coronary artery disease include dyslipidemia, diabetes mellitus, obesity, male gender, family history, smoking/tobacco exposure and sedentary lifestyle. Past treatments include ACE inhibitors. Hypertensive end-organ damage includes CVA.   Review of Systems  Constitutional: Negative for chills, fatigue, fever and unexpected weight change.   HENT: Negative for dental problem, mouth sores and trouble swallowing.   Eyes: Positive for blurred vision. Negative for visual disturbance.  Respiratory: Positive for cough. Negative for choking, chest tightness and wheezing.   Cardiovascular: Negative for chest pain, palpitations and leg swelling.  Gastrointestinal: Negative for abdominal distention, abdominal pain, constipation, diarrhea, nausea and vomiting.  Endocrine: Positive for polydipsia and polyuria. Negative for polyphagia.  Genitourinary: Negative for dysuria, flank pain, hematuria and urgency.  Musculoskeletal: Negative for back pain, gait problem, myalgias and neck pain.  Skin: Negative for pallor, rash and wound.  Neurological: Negative for seizures, syncope, weakness, numbness and headaches.  Psychiatric/Behavioral: Negative for confusion and dysphoric mood.    Objective:    BP (!) 150/82   Pulse 76   Ht 5' 7.5" (1.715 m)   Wt (!) 319 lb (144.7 kg)   BMI 49.23 kg/m   Wt Readings from Last 3 Encounters:  12/01/18 (!) 319 lb (144.7 kg)  09/09/18 Marland Kitchen)  306 lb 7 oz (139 kg)  09/05/18 (!) 307 lb 2 oz (139.3 kg)    Physical Exam  Constitutional: He is oriented to person, place, and time. He appears well-developed. He is cooperative. No distress.  Poor personal hygiene.  HENT:  Head: Normocephalic and atraumatic.  Neck: Normal range of motion. Neck supple. No tracheal deviation present. No thyromegaly present.  Cardiovascular: Normal rate, S1 normal and S2 normal. Exam reveals no gallop.  No murmur heard. Pulses:      Dorsalis pedis pulses are 1+ on the right side and 1+ on the left side.       Posterior tibial pulses are 1+ on the right side and 1+ on the left side.  Pulmonary/Chest: Effort normal. No respiratory distress. He has no wheezes.  Abdominal: Bowel sounds are normal. He exhibits no distension. There is no abdominal tenderness. There is no guarding and no CVA tenderness.  Musculoskeletal: Normal range of  motion.        General: No edema.     Right shoulder: He exhibits no swelling and no deformity.  Neurological: He is alert and oriented to person, place, and time. He has normal strength. No cranial nerve deficit or sensory deficit. Gait normal.  Skin: Skin is warm and dry. No rash noted. No cyanosis. Nails show no clubbing.  Psychiatric: He has a normal mood and affect. His speech is normal. Cognition and memory are normal.   Recent Results (from the past 2160 hour(s))  Glucose, capillary     Status: Abnormal   Collection Time: 09/05/18 10:56 AM  Result Value Ref Range   Glucose-Capillary 45 (L) 70 - 99 mg/dL  Glucose, capillary     Status: Abnormal   Collection Time: 09/05/18 11:11 AM  Result Value Ref Range   Glucose-Capillary 62 (L) 70 - 99 mg/dL  Glucose, capillary     Status: Abnormal   Collection Time: 09/05/18 11:29 AM  Result Value Ref Range   Glucose-Capillary 112 (H) 70 - 99 mg/dL  Basic metabolic panel     Status: Abnormal   Collection Time: 09/05/18 11:50 AM  Result Value Ref Range   Sodium 145 135 - 145 mmol/L   Potassium 3.9 3.5 - 5.1 mmol/L   Chloride 106 98 - 111 mmol/L   CO2 28 22 - 32 mmol/L   Glucose, Bld 131 (H) 70 - 99 mg/dL   BUN 34 (H) 8 - 23 mg/dL   Creatinine, Ser 1.52 (H) 0.61 - 1.24 mg/dL   Calcium 9.8 8.9 - 10.3 mg/dL   GFR calc non Af Amer 43 (L) >60 mL/min   GFR calc Af Amer 50 (L) >60 mL/min    Comment: (NOTE) The eGFR has been calculated using the CKD EPI equation. This calculation has not been validated in all clinical situations. eGFR's persistently <60 mL/min signify possible Chronic Kidney Disease.    Anion gap 11 5 - 15    Comment: Performed at [facility], [place] [address] [place], [place] [address]  CBC     Status: None   Collection Time: 09/05/18 11:50 AM  Result Value Ref Range   WBC 6.5 4.0 - 10.5 K/uL   RBC 5.47 4.22 - 5.81 MIL/uL   Hemoglobin 15.8 13.0 - 17.0 g/dL   HCT 50.3 39.0 - 52.0 %   MCV 92.0 80.0 -  100.0 fL   MCH 28.9 26.0 - 34.0 pg   MCHC 31.4 30.0 - 36.0 g/dL   RDW 14.3 11.5 -  15.5 %   Platelets 222 150 - 400 K/uL   nRBC 0.0 0.0 - 0.2 %    Comment: Performed at [facility], [place] [address] [place], [place] [address]  Hemoglobin A1c     Status: Abnormal   Collection Time: 09/05/18 11:50 AM  Result Value Ref Range   Hgb A1c MFr Bld 9.0 (H) 4.8 - 5.6 %    Comment: (NOTE) Pre diabetes:          5.7%-6.4% Diabetes:              >6.4% Glycemic control for   <7.0% adults with diabetes    Mean Plasma Glucose 211.6 mg/dL    Comment: Performed at [place] [address], [place], [place] [address]  Glucose, capillary     Status: Abnormal   Collection Time: 09/09/18  9:13 AM  Result Value Ref Range   Glucose-Capillary 40 (LL) 70 - 99 mg/dL   Comment 1 Notify RN    Comment 2 Document in Chart   Glucose, random     Status: Abnormal   Collection Time: 09/09/18  9:23 AM  Result Value Ref Range   Glucose, Bld 48 (L) 70 - 99 mg/dL    Comment: Performed at [facility], [place] [address], [place], [place] [address]  Basic metabolic panel     Status: Abnormal   Collection Time: 09/09/18  9:23 AM  Result Value Ref Range   Sodium 144 135 - 145 mmol/L   Potassium 4.3 3.5 - 5.1 mmol/L   Chloride 106 98 - 111 mmol/L   CO2 28 22 - 32 mmol/L   Glucose, Bld 46 (L) 70 - 99 mg/dL   BUN 26 (H) 8 - 23 mg/dL   Creatinine, Ser 1.42 (H) 0.61 - 1.24 mg/dL   Calcium 9.6 8.9 - 10.3 mg/dL   GFR calc non Af Amer 47 (L) >60 mL/min   GFR calc Af Amer 55 (L) >60 mL/min    Comment: (NOTE) The eGFR has been calculated using the CKD EPI equation. This calculation has not been validated in all clinical situations. eGFR's persistently <60 mL/min signify possible Chronic Kidney Disease.    Anion gap 10 5 - 15    Comment: Performed at [facility], [place] [address] [place], [place] [address]  Glucose, capillary     Status: None   Collection  Time: 09/09/18  9:57 AM  Result Value Ref Range   Glucose-Capillary 81 70 - 99 mg/dL  Glucose, capillary     Status: Abnormal   Collection Time: 09/09/18 10:30 AM  Result Value Ref Range   Glucose-Capillary 55 (L) 70 - 99 mg/dL  Glucose, capillary     Status: None   Collection Time: 09/09/18 11:11 AM  Result Value Ref Range   Glucose-Capillary 93 70 - 99 mg/dL  Glucose, capillary     Status: Abnormal   Collection Time: 09/09/18 12:44 PM  Result Value Ref Range   Glucose-Capillary 223 (H) 70 - 99 mg/dL   Comment 1 Call MD NNP PA CNM   Glucose, capillary     Status: Abnormal   Collection Time: 09/09/18  1:17 PM  Result Value Ref Range   Glucose-Capillary 156 (H) 70 - 99 mg/dL  Glucose, capillary     Status: Abnormal   Collection Time: 09/09/18  2:15 PM  Result Value Ref Range   Glucose-Capillary 143 (H) 70 - 99 mg/dL  Glucose, capillary  Status: Abnormal   Collection Time: 09/09/18  5:49 PM  Result Value Ref Range   Glucose-Capillary 158 (H) 70 - 99 mg/dL  Glucose, capillary     Status: Abnormal   Collection Time: 09/09/18  9:21 PM  Result Value Ref Range   Glucose-Capillary 362 (H) 70 - 99 mg/dL   Comment 1 Notify RN   Basic metabolic panel     Status: Abnormal   Collection Time: 09/10/18  4:28 AM  Result Value Ref Range   Sodium 138 135 - 145 mmol/L   Potassium 5.0 3.5 - 5.1 mmol/L   Chloride 101 98 - 111 mmol/L   CO2 26 22 - 32 mmol/L   Glucose, Bld 430 (H) 70 - 99 mg/dL   BUN 29 (H) 8 - 23 mg/dL   Creatinine, Ser 1.49 (H) 0.61 - 1.24 mg/dL   Calcium 8.5 (L) 8.9 - 10.3 mg/dL   GFR calc non Af Amer 44 (L) >60 mL/min   GFR calc Af Amer 52 (L) >60 mL/min    Comment: (NOTE) The eGFR has been calculated using the CKD EPI equation. This calculation has not been validated in all clinical situations. eGFR's persistently <60 mL/min signify possible Chronic Kidney Disease.    Anion gap 11 5 - 15    Comment: Performed at [facility], [place] [address] [place], [place] [address]  Glucose, capillary     Status: Abnormal   Collection Time: 09/10/18  8:57 AM  Result Value Ref Range   Glucose-Capillary 381 (H) 70 - 99 mg/dL  Glucose, capillary     Status: Abnormal   Collection Time: 09/10/18 11:36 AM  Result Value Ref Range   Glucose-Capillary 318 (H) 70 - 99 mg/dL  Glucose, capillary     Status: Abnormal   Collection Time: 09/10/18  5:03 PM  Result Value Ref Range   Glucose-Capillary 137 (H) 70 - 99 mg/dL  Culture, Urine     Status: None   Collection Time: 10/03/18 11:00 AM  Result Value Ref Range   Specimen Description      URINE, CLEAN CATCH Performed at [facility], [address] [place], [place] [address]    Special Requests      NONE Performed at [facility], [address] [place], [place] [address]    Culture      NO GROWTH Performed at [facility], [place] [address], [place], [place] [address]    Report Status 10/04/2018 FINAL      Assessment & Plan:   1. Uncontrolled type 2 diabetes mellitus with complication, with long-term current use of insulin ([place])   - Patient has currently uncontrolled symptomatic type 2 DM since  73 years of age. -He presents with worsening glycemic profile and A1c of 9% increasing from 8.3%.   - His recent labs are reviewed showing mild renal insufficiency,  he is tolerating low-dose metformin.   His diabetes is complicated by CVA, noncompliance, obesity/sedentary life, unaffordable medications and patient remains at a high risk for more acute and chronic complications of diabetes which include CAD, CVA, CKD, retinopathy, and neuropathy. These are all discussed in detail with the patient.  - I have counseled the patient on diet management and weight loss, by adopting a carbohydrate restricted/protein rich diet. -He admits to dietary indiscretion.  -  Suggestion is made for him to avoid simple carbohydrates  from his diet including Cakes, Sweet Desserts / Pastries, Ice Cream, Soda  (diet and regular), Sweet Tea,  Candies, Chips, Cookies, Store Bought Juices, Alcohol in Excess of  1-2 drinks a day, Artificial Sweeteners, and "Sugar-free" Products. This will help patient to have stable blood glucose profile and potentially avoid unintended weight gain.  - I encouraged the patient to switch to  unprocessed or minimally processed complex starch and increased protein intake (animal or plant source), fruits, and vegetables.  - Patient is advised to stick to a routine mealtimes to eat 3 meals  a day and avoid unnecessary snacks ( to snack only to correct hypoglycemia).   - I have approached patient with the following individualized plan to manage diabetes and patient agrees:   - He is struggling to control diabetes, this is mainly due to his dietary indiscretion- follows random meal and insulin timing. -He is advised to continue  monitoring blood glucose 4 times a day-before meals and at bedtime.    -He is advised to continue Lantus 80 units nightly,  continue NovoLog 25 units 3 times a day before meals for pre-meal blood glucose above 90 mg/dL. He is also given individualized correction dose from NovoLog for blood glucose readings above 150 mg/dL.  - I had a long discussion with him to maximize his caloric/carb restriction. -He is advised to discontinue glipizide. -He would have benefited from continuous glucose monitoring.  His insurance did not provide coverage.  -Patient is encouraged to call clinic for blood glucose levels less than 70 or above 300 mg /dl.  -He is advised to continue low-dose metformin 500 mg by mouth twice a day after breakfast and supper.  -A new meter and supplies is prescribed for him.  - Patient specific target  A1c;  LDL, HDL, Triglycerides, and  Waist Circumference were discussed in detail.  2) BP/HTN: His blood pressure is not controlled to target.  He is advised to continue lisinopril 10 mg p.o. daily, Lasix 40 mg p.o. daily as well.  He is  tolerating clonidine 0.2 mg twice a day, and clonidine is helping.   3) Lipids/HPL: His lipid panel showed controlled LDL at 58.   He is not on statins.  4)  Weight/Diet: Continues to gain weight,  CDE Consult  in progress  , exercise, and detailed carbohydrates information provided.  5) vitamin D deficiency: -He is on ongoing treatment with vitamin D3 5000 units daily.     6) Chronic Care/Health Maintenance:  -Patient is on ACEI/ARB  medications and encouraged to continue to follow up with Ophthalmology, Podiatrist at least yearly or according to recommendations, and advised to  quit smoking. I have recommended yearly flu vaccine and pneumonia vaccination at least every 5 years; and  sleep for at least 7 hours a day. He cannot tolerate any significant amount of exercise.    - I advised patient to maintain close follow up with Wyatt Haste, NP for primary care needs.  - Time spent with the patient: 25 min, of which >50% was spent in reviewing his blood glucose logs , discussing his hypo- and hyper-glycemic episodes, reviewing his current and  previous labs and insulin doses and developing a plan to avoid hypo- and hyper-glycemia. Please refer to Patient Instructions for Blood Glucose Monitoring and Insulin/Medications Dosing Guide"  in media tab for additional information. Denyce Robert participated in the discussions, expressed understanding, and voiced agreement with the above plans.  All questions were answered to his satisfaction. he is encouraged to contact clinic should he have any questions or concerns prior to his return visit.   Follow  up plan: - Return in about 3 months (around 03/02/2019) for Follow up with Pre-visit Labs, Meter, and Logs.  Glade Lloyd, MD Phone: [phone]  Fax: [phone]  -  This note was partially dictated with voice recognition software. Similar sounding words can be transcribed inadequately or may not  be corrected upon review.  12/01/2018, 10:14 AM

## 2018-12-02 ENCOUNTER — Other Ambulatory Visit: Payer: Self-pay

## 2019-01-02 ENCOUNTER — Ambulatory Visit: Payer: Self-pay | Admitting: Urology

## 2019-03-10 ENCOUNTER — Encounter: Payer: Medicare HMO | Admitting: "Endocrinology

## 2019-03-11 LAB — BASIC METABOLIC PANEL
BUN: 23 — AB (ref 4–21)
Creatinine: 1.5 — AB (ref <=1.3)
Glucose: 115
Potassium: 4.5 (ref 3.4–5.3)
Sodium: 143 (ref 137–147)

## 2019-03-11 LAB — HEPATIC FUNCTION PANEL
ALT: 23 (ref 10–40)
AST: 21 (ref 14–40)
Bilirubin, Total: 0.3

## 2019-03-11 LAB — HEMOGLOBIN A1C: Hemoglobin A1C: 9.6

## 2019-03-16 ENCOUNTER — Other Ambulatory Visit: Payer: Self-pay | Admitting: "Endocrinology

## 2019-03-16 ENCOUNTER — Encounter: Payer: Self-pay | Admitting: "Endocrinology

## 2019-03-16 ENCOUNTER — Ambulatory Visit (INDEPENDENT_AMBULATORY_CARE_PROVIDER_SITE_OTHER): Payer: Medicare HMO | Admitting: "Endocrinology

## 2019-03-16 ENCOUNTER — Other Ambulatory Visit: Payer: Self-pay

## 2019-03-16 DIAGNOSIS — N183 Chronic kidney disease, stage 3 unspecified: Secondary | ICD-10-CM

## 2019-03-16 DIAGNOSIS — Z794 Long term (current) use of insulin: Secondary | ICD-10-CM | POA: Diagnosis not present

## 2019-03-16 DIAGNOSIS — E1122 Type 2 diabetes mellitus with diabetic chronic kidney disease: Secondary | ICD-10-CM | POA: Diagnosis not present

## 2019-03-16 DIAGNOSIS — I10 Essential (primary) hypertension: Secondary | ICD-10-CM

## 2019-03-16 MED ORDER — GLIPIZIDE ER 5 MG PO TB24: 5.0000 mg | ORAL_TABLET | Freq: Every day | ORAL | 3 refills | Status: DC

## 2019-03-16 NOTE — Progress Notes (Signed)
Endocrinology Telehealth Visit Follow up Note -During COVID -19 Pandemic   Subjective:    Patient ID: Jeff Garcia, male    DOB: [birth year]. Patient is engaged in Telehealth  follow-up for management of chronically uncontrolled and complicated type 2 diabetes, hypertension, hyperlipidemia. PCP: Wyatt Haste, NP  Past Medical History:  Diagnosis Date  . Asthma   . BPH (benign prostatic hyperplasia)   . COPD (chronic obstructive pulmonary disease) ([place])   . Diabetes mellitus without complication ([place])   . Hyperlipidemia   . Hypertension   . Neuropathy   . Stroke [facility])    Past Surgical History:  Procedure Laterality Date  . BUNIONECTOMY  1993  . TRANSURETHRAL RESECTION OF PROSTATE N/A 09/09/2018   Procedure: TRANSURETHRAL RESECTION OF THE PROSTATE (TURP);  Surgeon: Irine Seal, MD;  Location: [facility];  Service: Urology;  Laterality: N/A;   Social History   Socioeconomic History  . Marital status: Divorced    Spouse name: Not on file  . Number of children: Not on file  . Years of education: Not on file  . Highest education level: Not on file  Occupational History  . Not on file  Social Needs  . Financial resource strain: Not on file  . Food insecurity:    Worry: Not on file    Inability: Not on file  . Transportation needs:    Medical: Not on file    Non-medical: Not on file  Tobacco Use  . Smoking status: Current Every Day Smoker    Packs/day: 1.00    Years: 50.00    Pack years: 50.00    Types: Cigarettes  . Smokeless tobacco: Never Used  Substance and Sexual Activity  . Alcohol use: Yes    Alcohol/week: 1.0 standard drinks    Types: 1 Cans of beer per week    Comment: rare  . Drug use: Yes    Types: Cocaine  . Sexual activity: Not on file  Lifestyle  . Physical activity:    Days per week: Not on file    Minutes per session: Not on file  . Stress: Not on file  Relationships  . Social connections:     Talks on phone: Not on file    Gets together: Not on file    Attends religious service: Not on file    Active member of club or organization: Not on file    Attends meetings of clubs or organizations: Not on file    Relationship status: Not on file  Other Topics Concern  . Not on file  Social History Narrative  . Not on file   Outpatient Encounter Medications as of 03/16/2019  Medication Sig  . aspirin EC 81 MG tablet Take 81 mg by mouth daily.  . Blood Glucose Monitoring Suppl (ACCU-CHEK GUIDE ME) w/Device KIT 1 Piece by Does not apply route as directed.  . cloNIDine (CATAPRES) 0.2 MG tablet TAKE 1 TABLET BY MOUTH TWICE DAILY (Patient taking differently: Take 0.2 mg by mouth 2 (two) times daily. )  . furosemide (LASIX) 40 MG tablet Take 40 mg by mouth daily.  Marland Kitchen glipiZIDE (GLUCOTROL XL) 5 MG 24 hr tablet Take 1 tablet (5 mg  total) by mouth daily with breakfast.  . glucose blood (ACCU-CHEK GUIDE) test strip Use as instructed  . hydrOXYzine (ATARAX/VISTARIL) 50 MG tablet Take 50 mg by mouth at bedtime as needed for anxiety.  . insulin aspart (NOVOLOG FLEXPEN) 100 UNIT/ML FlexPen Inject 25-31 Units into the skin 3 (three) times daily with meals. Per sliding scale  . Insulin Glargine (LANTUS SOLOSTAR) 100 UNIT/ML Solostar Pen Inject 80 Units into the skin at bedtime.   . metFORMIN (GLUCOPHAGE) 500 MG tablet Take 1 tablet (500 mg total) by mouth 2 (two) times daily with a meal.  . Misc Natural Products (PROSTATE HEALTH PO) Take 2 capsules by mouth daily.  . Potassium Chloride ER 20 MEQ TBCR Take 20 mEq by mouth daily.    No facility-administered encounter medications on file as of 03/16/2019.    ALLERGIES: Allergies  Allergen Reactions  . Shellfish Allergy     Pt vomits   VACCINATION STATUS:  There is no immunization history on file for this patient.  Diabetes  He presents for his follow-up diabetic visit. He has type 2 diabetes mellitus. Onset time: He was diagnosed at  approximate age of 48 years. His disease course has been worsening. There are no hypoglycemic associated symptoms. Pertinent negatives for hypoglycemia include no confusion, headaches, pallor or seizures. Associated symptoms include blurred vision, polydipsia and polyuria. Pertinent negatives for diabetes include no chest pain, no fatigue, no polyphagia and no weakness. There are no hypoglycemic complications. Symptoms are worsening. Diabetic complications include a CVA. Risk factors for coronary artery disease include diabetes mellitus, dyslipidemia, family history, hypertension, male sex, obesity, sedentary lifestyle and tobacco exposure. Current diabetic treatment includes insulin injections (He changed around the doses of NovoLog 70/30 taking up to 120 units at times despite my advice for him to take 60 units twice a day with breakfast and supper.). He is compliant with treatment some of the time. His weight is increasing steadily. He is following a generally unhealthy diet. He has not had a previous visit with a dietitian. He never participates in exercise. His home blood glucose trend is fluctuating minimally. His breakfast blood glucose range is generally 180-200 mg/dl. His lunch blood glucose range is generally >200 mg/dl. His dinner blood glucose range is generally >200 mg/dl. His bedtime blood glucose range is generally >200 mg/dl. His overall blood glucose range is >200 mg/dl. An ACE inhibitor/angiotensin II receptor blocker is being taken.  Hypertension  This is a chronic problem. The current episode started more than 1 year ago. The problem is uncontrolled. Associated symptoms include blurred vision. Pertinent negatives include no chest pain, headaches, neck pain or palpitations. Risk factors for coronary artery disease include dyslipidemia, diabetes mellitus, obesity, male gender, family history, smoking/tobacco exposure and sedentary lifestyle. Past treatments include ACE inhibitors. Hypertensive  end-organ damage includes CVA.     Objective:    There were no vitals taken for this visit.  Wt Readings from Last 3 Encounters:  12/01/18 (!) 319 lb (144.7 kg)  09/09/18 (!) 306 lb 7 oz (139 kg)  09/05/18 (!) 307 lb 2 oz (139.3 kg)    Recent Results (from the past 2160 hour(s))  Basic metabolic panel     Status: Abnormal   Collection Time: 03/11/19 12:00 AM  Result Value Ref Range   Glucose 115    BUN 23 (A) 4 - 21   Creatinine 1.5 (A) 0.6 - 1.3   Potassium 4.5 3.4 - 5.3   Sodium 143 137 - 147  Hepatic  function panel     Status: None   Collection Time: 03/11/19 12:00 AM  Result Value Ref Range   ALT 23 10 - 40   AST 21 14 - 40   Bilirubin, Total 0.3   Hemoglobin A1c     Status: None   Collection Time: 03/11/19 12:00 AM  Result Value Ref Range   Hemoglobin A1C 9.6      Assessment & Plan:   1. Uncontrolled type 2 diabetes mellitus with complication, with long-term current use of insulin ([place])   - Patient has currently uncontrolled symptomatic type 2 DM since  73 years of age. -He reports significantly fluctuating BG readings including some rare and random fasting hypoglycemia due to late breakfast. -His previsit labs show a1c of 9.6% increasing from  9%.  - His recent labs are reviewed showing mild renal insufficiency,  he is tolerating low-dose metformin.   His diabetes is complicated by CVA, noncompliance, obesity/sedentary life, unaffordable medications and patient remains at a high risk for more acute and chronic complications of diabetes which include CAD, CVA, CKD, retinopathy, and neuropathy. These are all discussed in detail with the patient.  - I have counseled the patient on diet management and weight loss, by adopting a carbohydrate restricted/protein rich diet. -He admits to dietary indiscretion.  - Patient admits there is a room for improvement in his diet and drink choices. -  Suggestion is made for him to avoid simple carbohydrates  from his diet  including Cakes, Sweet Desserts / Pastries, Ice Cream, Soda (diet and regular), Sweet Tea, Candies, Chips, Cookies, Store Bought Juices, Alcohol in Excess of  1-2 drinks a day, Artificial Sweeteners, and "Sugar-free" Products. This will help patient to have stable blood glucose profile and potentially avoid unintended weight gain.   - I encouraged the patient to switch to  unprocessed or minimally processed complex starch and increased protein intake (animal or plant source), fruits, and vegetables.  - Patient is advised to stick to a routine mealtimes to eat 3 meals  a day and avoid unnecessary snacks ( to snack only to correct hypoglycemia).   - I have approached patient with the following individualized plan to manage diabetes and patient agrees:   - He is struggling to control diabetes, this is mainly due to his dietary indiscretion- follows random meal and insulin timing. -He is advised to continue  monitoring blood glucose 4 times a day-before meals and at bedtime.    -He is advised to continue Lantus 80 units nightly,  continue NovoLog 25 units 3 times a day before meals for pre-meal blood glucose above 90 mg/dL. He is also given individualized correction dose from NovoLog for blood glucose readings above 150 mg/dL.  - I had a long discussion with him to maximize his caloric/carb restriction. -I discussed and added glipizide 62m XL po daily with breakfast. -He would have benefited from continuous glucose monitoring.  His insurance did not provide coverage.  -Patient is encouraged to call clinic for blood glucose levels less than 70 or above 300 mg /dl.  -He is advised to continue low-dose metformin 500 mg by mouth twice a day after breakfast and supper.      2) BP/HTN: he is advised to home monitor blood pressure and report if > 140/90 on 2 separate readings. He is advised to continue lisinopril 10 mg p.o. daily, Lasix 40 mg p.o. daily as well.  He is tolerating clonidine 0.2 mg  twice a day, and clonidine is helping.  3) Lipids/HPL: His lipid panel showed controlled LDL at 58.   He is not on statins.   5) vitamin D deficiency: -He is on ongoing treatment with vitamin D3 5000 units daily.     - I advised patient to maintain close follow up with Wyatt Haste, NP for primary care needs. - Patient Care Time Today:  25 min, of which >50% was spent in reviewing his  current and  previous labs/studies, blood glucose readings,  previous treatments, and medications doses and developing a plan for long-term care based on the latest recommendations for standards of care.  Jeff Garcia participated in the discussions, expressed understanding, and voiced agreement with the above plans.  All questions were answered to his satisfaction. he is encouraged to contact clinic should he have any questions or concerns prior to his return visit.   Follow up plan: - Return in about 3 months (around 06/15/2019) for Meter, and Logs.  Glade Lloyd, MD Phone: [phone]  Fax: [phone]  -  This note was partially dictated with voice recognition software. Similar sounding words can be transcribed inadequately or may not  be corrected upon review.  03/16/2019, 3:46 PM

## 2019-03-17 ENCOUNTER — Other Ambulatory Visit: Payer: Self-pay | Admitting: "Endocrinology

## 2019-03-23 ENCOUNTER — Other Ambulatory Visit: Payer: Self-pay

## 2019-03-23 MED ORDER — BLOOD GLUCOSE METER KIT: PACK | 5 refills | Status: AC

## 2019-03-24 ENCOUNTER — Other Ambulatory Visit: Payer: Self-pay

## 2019-05-04 LAB — BASIC METABOLIC PANEL
BUN: 14 (ref 4–21)
Creatinine: 0.9 (ref 0.6–1.3)

## 2019-05-04 LAB — TSH: TSH: 1.02 (ref 0.41–5.90)

## 2019-06-17 ENCOUNTER — Ambulatory Visit: Payer: Medicare HMO | Admitting: "Endocrinology

## 2019-07-02 ENCOUNTER — Encounter (INDEPENDENT_AMBULATORY_CARE_PROVIDER_SITE_OTHER): Payer: Self-pay

## 2019-07-02 ENCOUNTER — Ambulatory Visit (INDEPENDENT_AMBULATORY_CARE_PROVIDER_SITE_OTHER): Payer: Medicare HMO | Admitting: "Endocrinology

## 2019-07-02 ENCOUNTER — Other Ambulatory Visit: Payer: Self-pay

## 2019-07-02 ENCOUNTER — Encounter: Payer: Self-pay | Admitting: "Endocrinology

## 2019-07-02 VITALS — BP 129/82 | HR 108 | Ht 67.5 in | Wt 306.0 lb

## 2019-07-02 DIAGNOSIS — I10 Essential (primary) hypertension: Secondary | ICD-10-CM

## 2019-07-02 DIAGNOSIS — E1122 Type 2 diabetes mellitus with diabetic chronic kidney disease: Secondary | ICD-10-CM | POA: Diagnosis not present

## 2019-07-02 DIAGNOSIS — N183 Chronic kidney disease, stage 3 (moderate): Secondary | ICD-10-CM

## 2019-07-02 DIAGNOSIS — Z794 Long term (current) use of insulin: Secondary | ICD-10-CM

## 2019-07-02 MED ORDER — GLIPIZIDE ER 5 MG PO TB24: 10.0000 mg | ORAL_TABLET | Freq: Every day | ORAL | 3 refills | Status: DC

## 2019-07-02 NOTE — Progress Notes (Signed)
07/02/2019  Endocrinology follow-up note  Subjective:    Patient ID: Jeff Garcia, male    DOB: 04-28-1946. Patient is returning to clinic for  follow-up for management of chronically uncontrolled and complicated type 2 diabetes, hypertension, hyperlipidemia.  PCP: Jeff Haste, NP  Past Medical History:  Diagnosis Date  . Asthma   . BPH (benign prostatic hyperplasia)   . COPD (chronic obstructive pulmonary disease) ([place])   . Diabetes mellitus without complication ([place])   . Hyperlipidemia   . Hypertension   . Neuropathy   . Stroke [facility])    Past Surgical History:  Procedure Laterality Date  . BUNIONECTOMY  1993  . TRANSURETHRAL RESECTION OF PROSTATE N/A 09/09/2018   Procedure: TRANSURETHRAL RESECTION OF THE PROSTATE (TURP);  Surgeon: Irine Seal, MD;  Location: [facility];  Service: Urology;  Laterality: N/A;   Social History   Socioeconomic History  . Marital status: Divorced    Spouse name: Not on file  . Number of children: Not on file  . Years of education: Not on file  . Highest education level: Not on file  Occupational History  . Not on file  Social Needs  . Financial resource strain: Not on file  . Food insecurity    Worry: Not on file    Inability: Not on file  . Transportation needs    Medical: Not on file    Non-medical: Not on file  Tobacco Use  . Smoking status: Current Every Day Smoker    Packs/day: 1.00    Years: 50.00    Pack years: 50.00    Types: Cigarettes  . Smokeless tobacco: Never Used  Substance and Sexual Activity  . Alcohol use: Yes    Alcohol/week: 1.0 standard drinks    Types: 1 Cans of beer per week    Comment: rare  . Drug use: Yes    Types: Cocaine  . Sexual activity: Not on file  Lifestyle  . Physical activity    Days per week: Not on file    Minutes per session: Not on file  . Stress: Not on file  Relationships  . Social Herbalist on phone: Not on file    Gets together: Not on file    Attends religious  service: Not on file    Active member of club or organization: Not on file    Attends meetings of clubs or organizations: Not on file    Relationship status: Not on file  Other Topics Concern  . Not on file  Social History Narrative  . Not on file   Outpatient Encounter Medications as of 07/02/2019  Medication Sig  . aspirin EC 81 MG tablet Take 81 mg by mouth daily.  . blood glucose meter kit and supplies Dispense based on patient and insurance preference. Use up to four times daily as directed. (FOR ICD-10 E11.65)  . Blood Glucose Monitoring Suppl (ACCU-CHEK GUIDE ME) w/Device KIT 1 Piece by Does not apply route as directed.  . cloNIDine (CATAPRES) 0.2 MG tablet Take 1 tablet by mouth twice daily  . furosemide (LASIX) 40 MG tablet Take 40 mg by mouth daily.  Marland Kitchen glipiZIDE (GLUCOTROL XL) 5 MG 24 hr tablet Take 2 tablets (10 mg total) by mouth daily with breakfast.  . glucose blood (ACCU-CHEK GUIDE) test strip Use as instructed  . hydrOXYzine (ATARAX/VISTARIL) 50 MG tablet Take 50 mg by mouth at bedtime as needed for anxiety.  . insulin aspart (NOVOLOG FLEXPEN)  100 UNIT/ML FlexPen Inject 25-31 Units into the skin 3 (three) times daily with meals. Per sliding scale  . Insulin Glargine (LANTUS SOLOSTAR) 100 UNIT/ML Solostar Pen Inject 80 Units into the skin at bedtime.   . metFORMIN (GLUCOPHAGE) 500 MG tablet Take 1 tablet (500 mg total) by mouth 2 (two) times daily with a meal.  . Misc Natural Products (PROSTATE HEALTH PO) Take 2 capsules by mouth daily.  . Potassium Chloride ER 20 MEQ TBCR Take 20 mEq by mouth daily.   . [DISCONTINUED] glipiZIDE (GLUCOTROL XL) 5 MG 24 hr tablet Take 1 tablet (5 mg total) by mouth daily with breakfast.   No facility-administered encounter medications on file as of 07/02/2019.    ALLERGIES: Allergies  Allergen Reactions  . Shellfish Allergy     Pt vomits   VACCINATION STATUS:  There is no immunization history on file for this patient.  Diabetes He  presents for his follow-up diabetic visit. He has type 2 diabetes mellitus. Onset time: He was diagnosed at approximate age of 57 years. His disease course has been improving. There are no hypoglycemic associated symptoms. Pertinent negatives for hypoglycemia include no confusion, headaches, pallor or seizures. Associated symptoms include blurred vision, polydipsia and polyuria. Pertinent negatives for diabetes include no chest pain, no fatigue, no polyphagia and no weakness. There are no hypoglycemic complications. Symptoms are improving. Diabetic complications include a CVA. Risk factors for coronary artery disease include diabetes mellitus, dyslipidemia, family history, hypertension, male sex, obesity, sedentary lifestyle and tobacco exposure. Current diabetic treatment includes insulin injections (He changed around the doses of NovoLog 70/30 taking up to 120 units at times despite my advice for him to take 60 units twice a day with breakfast and supper.). He is compliant with treatment some of the time. His weight is increasing steadily. He is following a generally unhealthy diet. He has not had a previous visit with a dietitian. He never participates in exercise. His home blood glucose trend is fluctuating minimally. His breakfast blood glucose range is generally 180-200 mg/dl. His lunch blood glucose range is generally >200 mg/dl. His dinner blood glucose range is generally >200 mg/dl. His bedtime blood glucose range is generally >200 mg/dl. His overall blood glucose range is >200 mg/dl. An ACE inhibitor/angiotensin II receptor blocker is being taken.  Hypertension This is a chronic problem. The current episode started more than 1 year ago. The problem is uncontrolled. Associated symptoms include blurred vision. Pertinent negatives include no chest pain, headaches, neck pain or palpitations. Risk factors for coronary artery disease include dyslipidemia, diabetes mellitus, obesity, male gender, family  history, smoking/tobacco exposure and sedentary lifestyle. Past treatments include ACE inhibitors. Hypertensive end-organ damage includes CVA.     Objective:    BP 129/82   Pulse (!) 108   Ht 5' 7.5" (1.715 m)   Wt (!) 306 lb (138.8 kg)   BMI 47.22 kg/m   Wt Readings from Last 3 Encounters:  07/02/19 (!) 306 lb (138.8 kg)  12/01/18 (!) 319 lb (144.7 kg)  09/09/18 (!) 306 lb 7 oz (139 kg)     Physical Exam- Limited  Constitutional:  + Patient has chronic dyspnea on exertion, normal state of mind Eyes:  EOMI, no exophthalmos Neck: Supple Respiratory: Adequate breathing efforts Musculoskeletal: no gross deformities, strength intact in all four extremities, no gross restriction of joint movements Skin:  no rashes, no hyperemia Neurological: no tremor with outstretched hands.  Recent Results (from the past 2160 hour(s))  Basic metabolic panel  Status: None   Collection Time: 05/04/19 12:00 AM  Result Value Ref Range   BUN 14 4 - 21   Creatinine 0.9 0.6 - 1.3  TSH     Status: None   Collection Time: 05/04/19 12:00 AM  Result Value Ref Range   TSH 1.02 0.41 - 5.90     Assessment & Plan:   1. Uncontrolled type 2 diabetes mellitus with complication, with long-term current use of insulin ([place])   - Patient has currently uncontrolled symptomatic type 2 DM since  73 years of age. -He has responded to recent addition of glipizide with slightly improving glycemic profile.  His previsit labs did not show A1c, last visit A1c was 9.6%.   - His recent labs are reviewed showing near normal renal function.    he is tolerating low-dose metformin.   His diabetes is complicated by CVA, noncompliance, obesity/sedentary life, unaffordable medications and patient remains at a high risk for more acute and chronic complications of diabetes which include CAD, CVA, CKD, retinopathy, and neuropathy. These are all discussed in detail with the patient.  - I have counseled the patient on diet  management and weight loss, by adopting a carbohydrate restricted/protein rich diet. -He admits to dietary indiscretion.  - he  admits there is a room for improvement in his diet and drink choices. -  Suggestion is made for him to avoid simple carbohydrates  from his diet including Cakes, Sweet Desserts / Pastries, Ice Cream, Soda (diet and regular), Sweet Tea, Candies, Chips, Cookies, Sweet Pastries,  Store Bought Juices, Alcohol in Excess of  1-2 drinks a day, Artificial Sweeteners, Coffee Creamer, and "Sugar-free" Products. This will help patient to have stable blood glucose profile and potentially avoid unintended weight gain.  - I encouraged the patient to switch to  unprocessed or minimally processed complex starch and increased protein intake (animal or plant source), fruits, and vegetables.  - Patient is advised to stick to a routine mealtimes to eat 3 meals  a day and avoid unnecessary snacks ( to snack only to correct hypoglycemia).   - I have approached patient with the following individualized plan to manage diabetes and patient agrees:   - He is struggling to control diabetes, this is mainly due to his dietary indiscretion- follows random meal and insulin timing. -He is advised to continue monitoring blood glucose 4 times a day-before meals and at bedtime.    -He is advised to continue Lantus 80 units nightly ,  continue NovoLog 25 units 3 times a day before meals for pre-meal blood glucose above 90 mg/dL. He is also given individualized correction dose from NovoLog for blood glucose readings above 150 mg/dL.  -He is advised to increase his glipizide to 10 mg XL p.o. daily at breakfast.   -He would have benefited from continuous glucose monitoring.  His insurance did not provide coverage.  -Patient is encouraged to call clinic for blood glucose levels less than 70 or above 300 mg /dl.  -He is advised to continue low-dose metformin 500 mg by mouth twice a day after breakfast and  supper.   2) BP/HTN: His blood pressure is controlled to target. He is advised to continue lisinopril 10 mg p.o. daily, Lasix 40 mg p.o. daily as well.  He is tolerating clonidine 0.2 mg twice a day, and clonidine is helping.   3) Lipids/HPL: His lipid panel showed controlled LDL at 58.   He is not on statins.   5) vitamin D deficiency: -  He is on ongoing treatment with vitamin D3 5000 units daily, advised to continue on same.   - I advised patient to maintain close follow up with Jeff Haste, NP for primary care needs.  - Patient Care Time Today:  25 min, of which >50% was spent in  counseling and the rest reviewing his  current and  previous labs/studies, previous treatments, his blood glucose readings, and medications' doses and developing a plan for long-term care based on the latest recommendations for standards of care.   Jeff Garcia participated in the discussions, expressed understanding, and voiced agreement with the above plans.  All questions were answered to his satisfaction. he is encouraged to contact clinic should he have any questions or concerns prior to his return visit.   Follow up plan: - Return in about 3 months (around 10/02/2019) for Follow up with Pre-visit Labs, Meter, and Logs.  Jeff Lloyd, MD Phone: [phone]  Fax: [phone]  -  This note was partially dictated with voice recognition software. Similar sounding words can be transcribed inadequately or may not  be corrected upon review.  07/02/2019, 4:25 PM

## 2019-07-02 NOTE — Patient Instructions (Signed)

## 2019-07-03 ENCOUNTER — Ambulatory Visit: Payer: Medicare HMO | Admitting: "Endocrinology

## 2019-07-09 ENCOUNTER — Telehealth: Payer: Self-pay | Admitting: "Endocrinology

## 2019-07-09 ENCOUNTER — Other Ambulatory Visit: Payer: Self-pay

## 2019-07-09 MED ORDER — ACCU-CHEK GUIDE VI STRP: ORAL_STRIP | 2 refills | Status: DC

## 2019-07-09 MED ORDER — ACCU-CHEK GUIDE VI STRP: ORAL_STRIP | 2 refills | Status: AC

## 2019-07-09 NOTE — Telephone Encounter (Signed)
Rx sent 

## 2019-07-09 NOTE — Telephone Encounter (Signed)
Patient needs a new RX for his test strips. He said he test at a minimum of 4x a day. He is currently out and the pharmacy said he can not fill again until 14th. [facility] [place].

## 2019-07-09 NOTE — Addendum Note (Signed)
Addended by: Lavell Luster A on: 07/09/2019 09:04 AM   Modules accepted: Orders

## 2019-07-24 ENCOUNTER — Ambulatory Visit (INDEPENDENT_AMBULATORY_CARE_PROVIDER_SITE_OTHER): Payer: Medicare HMO | Admitting: Urology

## 2019-07-24 DIAGNOSIS — N3941 Urge incontinence: Secondary | ICD-10-CM

## 2019-07-24 DIAGNOSIS — N401 Enlarged prostate with lower urinary tract symptoms: Secondary | ICD-10-CM | POA: Diagnosis not present

## 2019-07-24 DIAGNOSIS — R351 Nocturia: Secondary | ICD-10-CM | POA: Diagnosis not present

## 2019-08-12 ENCOUNTER — Telehealth: Payer: Self-pay | Admitting: "Endocrinology

## 2019-08-12 MED ORDER — GLUCOSE BLOOD VI STRP: 1.0000 | ORAL_STRIP | Freq: Four times a day (QID) | 5 refills | Status: AC

## 2019-08-12 NOTE — Telephone Encounter (Signed)
Patient said he needs a new script for his test strips. He said he is testing a minimum of 4x a day. He is running out too soon. [facility] [place]

## 2019-08-21 ENCOUNTER — Other Ambulatory Visit: Payer: Self-pay

## 2019-10-06 ENCOUNTER — Ambulatory Visit: Payer: Medicare HMO | Admitting: "Endocrinology

## 2019-10-06 ENCOUNTER — Encounter: Payer: Self-pay | Admitting: "Endocrinology

## 2019-10-09 ENCOUNTER — Other Ambulatory Visit: Payer: Self-pay

## 2019-10-09 ENCOUNTER — Ambulatory Visit (HOSPITAL_COMMUNITY)
Admission: RE | Admit: 2019-10-09 | Discharge: 2019-10-09 | Disposition: A | Discharge status: Home / Self Care | Payer: Medicare HMO | Source: Ambulatory Visit | Attending: Family | Admitting: Family

## 2019-10-09 ENCOUNTER — Other Ambulatory Visit (HOSPITAL_COMMUNITY): Payer: Self-pay | Admitting: Family

## 2019-10-09 DIAGNOSIS — M5441 Lumbago with sciatica, right side: Secondary | ICD-10-CM

## 2019-10-09 DIAGNOSIS — M545 Low back pain, unspecified: Secondary | ICD-10-CM

## 2019-10-09 DIAGNOSIS — M5442 Lumbago with sciatica, left side: Secondary | ICD-10-CM | POA: Diagnosis not present

## 2019-10-09 DIAGNOSIS — R102 Pelvic and perineal pain: Secondary | ICD-10-CM

## 2019-10-16 ENCOUNTER — Ambulatory Visit (INDEPENDENT_AMBULATORY_CARE_PROVIDER_SITE_OTHER): Payer: Medicare HMO | Admitting: Urology

## 2019-10-16 ENCOUNTER — Other Ambulatory Visit (HOSPITAL_COMMUNITY)
Admission: AD | Admit: 2019-10-16 | Discharge: 2019-10-16 | Disposition: A | Discharge status: Home / Self Care | Payer: Medicare HMO | Source: Other Acute Inpatient Hospital | Attending: Urology | Admitting: Urology

## 2019-10-16 DIAGNOSIS — N401 Enlarged prostate with lower urinary tract symptoms: Secondary | ICD-10-CM

## 2019-10-16 DIAGNOSIS — N3941 Urge incontinence: Secondary | ICD-10-CM | POA: Diagnosis present

## 2019-10-16 LAB — URINALYSIS, COMPLETE (UACMP) WITH MICROSCOPIC
Bilirubin Urine: NEGATIVE
Glucose, UA: 50 mg/dL — AB
Hgb urine dipstick: NEGATIVE
Ketones, ur: NEGATIVE mg/dL
Nitrite: POSITIVE — AB
Protein, ur: NEGATIVE mg/dL
Specific Gravity, Urine: 1.018 (ref 1.005–1.030)
WBC, UA: >50 WBC/hpf — ABNORMAL HIGH (ref 0–5)
pH: 5 (ref 5.0–8.0)

## 2019-10-18 LAB — URINE CULTURE: Culture: >=100000 — AB

## 2019-10-28 LAB — COMPREHENSIVE METABOLIC PANEL
Albumin: 4.2 (ref 3.5–5.0)
Calcium: 9.5 (ref 8.7–10.7)
Globulin: 2.8

## 2019-10-28 LAB — BASIC METABOLIC PANEL
BUN: 38 — AB (ref 4–21)
CO2: 35 — AB (ref 13–22)
Chloride: 104 (ref 99–108)
Creatinine: 1.7 — AB (ref 0.6–1.3)
Glucose: 79
Potassium: 3.9 (ref 3.4–5.3)
Sodium: 146 (ref 137–147)

## 2019-10-28 LAB — CBC AND DIFFERENTIAL
HCT: 45 (ref 41–53)
Hemoglobin: 15.2 (ref 13.5–17.5)
Platelets: 232 (ref 150–399)
WBC: 6.1

## 2019-10-28 LAB — HEPATIC FUNCTION PANEL
ALT: 18 (ref 10–40)
AST: 17 (ref 14–40)
Alkaline Phosphatase: 78 (ref 25–125)
Bilirubin, Total: 0.3

## 2019-10-28 LAB — HEMOGLOBIN A1C: Hemoglobin A1C: 8.8

## 2019-10-28 LAB — CBC: RBC: 5.01 (ref 3.87–5.11)

## 2019-11-05 ENCOUNTER — Ambulatory Visit (INDEPENDENT_AMBULATORY_CARE_PROVIDER_SITE_OTHER): Payer: Medicare HMO | Admitting: "Endocrinology

## 2019-11-05 ENCOUNTER — Encounter: Payer: Self-pay | Admitting: "Endocrinology

## 2019-11-05 DIAGNOSIS — I10 Essential (primary) hypertension: Secondary | ICD-10-CM

## 2019-11-05 DIAGNOSIS — E1121 Type 2 diabetes mellitus with diabetic nephropathy: Secondary | ICD-10-CM | POA: Diagnosis not present

## 2019-11-05 DIAGNOSIS — Z794 Long term (current) use of insulin: Secondary | ICD-10-CM | POA: Diagnosis not present

## 2019-11-05 DIAGNOSIS — E1122 Type 2 diabetes mellitus with diabetic chronic kidney disease: Secondary | ICD-10-CM

## 2019-11-05 DIAGNOSIS — N1831 Chronic kidney disease, stage 3a: Secondary | ICD-10-CM

## 2019-11-05 MED ORDER — FREESTYLE LIBRE 14 DAY SENSOR MISC: 1.0000 | 2 refills | Status: AC

## 2019-11-05 MED ORDER — FREESTYLE LIBRE 14 DAY READER DEVI: 1.0000 | Freq: Once | 0 refills | Status: AC

## 2019-11-05 NOTE — Patient Instructions (Signed)

## 2019-11-05 NOTE — Progress Notes (Signed)
11/05/2019                                                     Endocrinology Telehealth Visit Follow up Note -During COVID -19 Pandemic  This visit type was conducted due to national recommendations for restrictions regarding the COVID-19 Pandemic  in an effort to limit this patient's exposure and mitigate transmission of the corona virus.  Due to his co-morbid illnesses, Jeff Garcia is at  moderate to high risk for complications without adequate follow up.  This format is felt to be most appropriate for him at this time.  I connected with this patient on 11/05/2019   by telephone and verified that I am speaking with the correct person using two identifiers. Jeff Garcia, [id number]. he has verbally consented to this visit. All issues noted in this document were discussed and addressed. The format was not optimal for physical exam.    Subjective:    Patient ID: Jeff Garcia, male    DOB: [birth year]. Patient is engaged in telehealth via telephone  for  follow-up for management of chronically uncontrolled and complicated type 2 diabetes, hypertension, hyperlipidemia.  PCP: Wyatt Haste, NP  Past Medical History:  Diagnosis Date  . Asthma   . BPH (benign prostatic hyperplasia)   . COPD (chronic obstructive pulmonary disease) ([place])   . Diabetes mellitus without complication ([place])   . Hyperlipidemia   . Hypertension   . Neuropathy   . Stroke [facility])    Past Surgical History:  Procedure Laterality Date  . BUNIONECTOMY  1993  . TRANSURETHRAL RESECTION OF PROSTATE N/A 09/09/2018   Procedure: TRANSURETHRAL RESECTION OF THE PROSTATE (TURP);  Surgeon: Irine Seal, MD;  Location: [facility];  Service: Urology;  Laterality: N/A;   Social History   Socioeconomic History  . Marital status: Divorced    Spouse name: Not on file  . Number of children: Not on file  . Years of education: Not on file  . Highest education level: Not on file  Occupational History  . Not on file  Tobacco Use   . Smoking status: Current Every Day Smoker    Packs/day: 1.00    Years: 50.00    Pack years: 50.00    Types: Cigarettes  . Smokeless tobacco: Never Used  Substance and Sexual Activity  . Alcohol use: Yes    Alcohol/week: 1.0 standard drinks    Types: 1 Cans of beer per week    Comment: rare  . Drug use: Yes    Types: Cocaine  . Sexual activity: Not on file  Other Topics Concern  . Not on file  Social History Narrative  . Not on file   Social Determinants of Health   Financial Resource Strain:   . Difficulty of Paying Living Expenses: Not on file  Food Insecurity:   . Worried About Charity fundraiser in the Last Year: Not on file  . Ran Out of Food in the Last Year: Not on file  Transportation Needs:   . Lack of Transportation (Medical): Not on file  . Lack of Transportation (Non-Medical): Not on file  Physical Activity:   . Days of Exercise per Week: Not on file  . Minutes of Exercise per Session: Not on file  Stress:   . Feeling of Stress : Not on  file  Social Connections:   . Frequency of Communication with Friends and Family: Not on file  . Frequency of Social Gatherings with Friends and Family: Not on file  . Attends Religious Services: Not on file  . Active Member of Clubs or Organizations: Not on file  . Attends Archivist Meetings: Not on file  . Marital Status: Not on file   Outpatient Encounter Medications as of 11/05/2019  Medication Sig  . aspirin EC 81 MG tablet Take 81 mg by mouth daily.  . blood glucose meter kit and supplies Dispense based on patient and insurance preference. Use up to four times daily as directed. (FOR ICD-10 E11.65)  . Blood Glucose Monitoring Suppl (ACCU-CHEK GUIDE ME) w/Device KIT 1 Piece by Does not apply route as directed.  . cloNIDine (CATAPRES) 0.2 MG tablet Take 1 tablet by mouth twice daily  . Continuous Blood Gluc Receiver (FREESTYLE LIBRE 14 DAY READER) DEVI 1 each by Does not apply route once for 1 dose.  .  Continuous Blood Gluc Sensor (FREESTYLE LIBRE 14 DAY SENSOR) MISC Inject 1 each into the skin every 14 (fourteen) days. Use as directed.  . furosemide (LASIX) 40 MG tablet Take 40 mg by mouth daily.  Marland Kitchen glipiZIDE (GLUCOTROL XL) 5 MG 24 hr tablet Take 2 tablets (10 mg total) by mouth daily with breakfast.  . glucose blood (ACCU-CHEK GUIDE) test strip Use as instructed 4 x daily. e11.65  . glucose blood test strip 1 each by Other route 4 (four) times daily. Use as instructed 4 x daily. e11.65  . hydrOXYzine (ATARAX/VISTARIL) 50 MG tablet Take 50 mg by mouth at bedtime as needed for anxiety.  . insulin aspart (NOVOLOG FLEXPEN) 100 UNIT/ML FlexPen Inject 25-31 Units into the skin 3 (three) times daily with meals. Per sliding scale  . Insulin Glargine (LANTUS SOLOSTAR) 100 UNIT/ML Solostar Pen Inject 80 Units into the skin at bedtime.   . metFORMIN (GLUCOPHAGE) 500 MG tablet Take 1 tablet (500 mg total) by mouth 2 (two) times daily with a meal.  . Misc Natural Products (PROSTATE HEALTH PO) Take 2 capsules by mouth daily.  . Potassium Chloride ER 20 MEQ TBCR Take 20 mEq by mouth daily.    No facility-administered encounter medications on file as of 11/05/2019.   ALLERGIES: Allergies  Allergen Reactions  . Shellfish Allergy     Pt vomits   VACCINATION STATUS:  There is no immunization history on file for this patient.  Diabetes He presents for his follow-up diabetic visit. He has type 2 diabetes mellitus. Onset time: He was diagnosed at approximate age of 9 years. His disease course has been worsening. There are no hypoglycemic associated symptoms. Pertinent negatives for hypoglycemia include no confusion, headaches, pallor or seizures. Associated symptoms include blurred vision, polydipsia and polyuria. Pertinent negatives for diabetes include no chest pain, no fatigue, no polyphagia and no weakness. There are no hypoglycemic complications. Symptoms are improving. Diabetic complications include a  CVA. Risk factors for coronary artery disease include diabetes mellitus, dyslipidemia, family history, hypertension, male sex, obesity, sedentary lifestyle and tobacco exposure. Current diabetic treatment includes insulin injections (He changed around the doses of NovoLog 70/30 taking up to 120 units at times despite my advice for him to take 60 units twice a day with breakfast and supper.). He is compliant with treatment some of the time. He is following a generally unhealthy diet. He has not had a previous visit with a dietitian. He never participates in exercise.  His home blood glucose trend is fluctuating minimally. His breakfast blood glucose range is generally 180-200 mg/dl. His lunch blood glucose range is generally >200 mg/dl. His dinner blood glucose range is generally 180-200 mg/dl. His bedtime blood glucose range is generally >200 mg/dl. His overall blood glucose range is >200 mg/dl. (Reports random hypoglycemia which are less frequent than before.  He is previsit labs show improved A1c of 8.8% from 9.6%.) An ACE inhibitor/angiotensin II receptor blocker is being taken.  Hypertension This is a chronic problem. The current episode started more than 1 year ago. The problem is uncontrolled. Associated symptoms include blurred vision. Pertinent negatives include no chest pain, headaches, neck pain or palpitations. Risk factors for coronary artery disease include dyslipidemia, diabetes mellitus, obesity, male gender, family history, smoking/tobacco exposure and sedentary lifestyle. Past treatments include ACE inhibitors. Hypertensive end-organ damage includes CVA.     Objective:    There were no vitals taken for this visit.  Wt Readings from Last 3 Encounters:  07/02/19 (!) 306 lb (138.8 kg)  12/01/18 (!) 319 lb (144.7 kg)  09/09/18 (!) 306 lb 7 oz (139 kg)      Recent Results (from the past 2160 hour(s))  Culture, Urine     Status: Abnormal   Collection Time: 10/16/19  5:37 PM   Specimen:  Urine, Clean Catch  Result Value Ref Range   Specimen Description      URINE, CLEAN CATCH Performed at [facility], [address] [place], [place] [address]    Special Requests      NONE Performed at [facility], [address] [place], [place] [address]    Culture >=100,000 COLONIES/mL KLEBSIELLA PNEUMONIAE (A)    Report Status 10/18/2019 FINAL    Organism ID, Bacteria KLEBSIELLA PNEUMONIAE (A)       Susceptibility   Klebsiella pneumoniae - MIC*    AMPICILLIN RESISTANT Resistant     CEFAZOLIN <=4 SENSITIVE Sensitive     CEFTRIAXONE <=1 SENSITIVE Sensitive     CIPROFLOXACIN <=0.25 SENSITIVE Sensitive     GENTAMICIN <=1 SENSITIVE Sensitive     IMIPENEM <=0.25 SENSITIVE Sensitive     NITROFURANTOIN 64 INTERMEDIATE Intermediate     TRIMETH/SULFA <=20 SENSITIVE Sensitive     AMPICILLIN/SULBACTAM <=2 SENSITIVE Sensitive     PIP/TAZO <=4 SENSITIVE Sensitive     Extended ESBL NEGATIVE Sensitive     * >=100,000 COLONIES/mL KLEBSIELLA PNEUMONIAE  Urinalysis, Complete w Microscopic     Status: Abnormal   Collection Time: 10/16/19  5:37 PM  Result Value Ref Range   Color, Urine YELLOW YELLOW   APPearance HAZY (A) CLEAR   Specific Gravity, Urine 1.018 1.005 - 1.030   pH 5.0 5.0 - 8.0   Glucose, UA 50 (A) NEGATIVE mg/dL   Hgb urine dipstick NEGATIVE NEGATIVE   Bilirubin Urine NEGATIVE NEGATIVE   Ketones, ur NEGATIVE NEGATIVE mg/dL   Protein, ur NEGATIVE NEGATIVE mg/dL   Nitrite POSITIVE (A) NEGATIVE   Leukocytes,Ua MODERATE (A) NEGATIVE   RBC / HPF 0-5 0 - 5 RBC/hpf   WBC, UA >50 (H) 0 - 5 WBC/hpf   Bacteria, UA MANY (A) NONE SEEN   Squamous Epithelial / LPF 0-5 0 - 5   WBC Clumps PRESENT    Mucus PRESENT     Comment: Performed at [facility], [address], [place],  [address]  CBC and differential     Status: None   Collection Time: 10/28/19 12:00 AM  Result Value Ref Range  Hemoglobin 15.2 13.5 - 17.5   HCT 45 41 - 53   Platelets 232 150 - 399   WBC 6.1   CBC      Status: None   Collection Time: 10/28/19 12:00 AM  Result Value Ref Range   RBC 5.01 3.87 - 8.10  Basic metabolic panel     Status: Abnormal   Collection Time: 10/28/19 12:00 AM  Result Value Ref Range   Glucose 79    BUN 38 (A) 4 - 21   CO2 35 (A) 13 - 22   Creatinine 1.7 (A) 0.6 - 1.3   Potassium 3.9 3.4 - 5.3   Sodium 146 137 - 147   Chloride 104 99 - 108  Comprehensive metabolic panel     Status: None   Collection Time: 10/28/19 12:00 AM  Result Value Ref Range   Globulin 2.8    Calcium 9.5 8.7 - 10.7   Albumin 4.2 3.5 - 5.0  Hepatic function panel     Status: None   Collection Time: 10/28/19 12:00 AM  Result Value Ref Range   Alkaline Phosphatase 78 25 - 125   ALT 18 10 - 40   AST 17 14 - 40   Bilirubin, Total 0.3   Hemoglobin A1c     Status: None   Collection Time: 10/28/19 12:00 AM  Result Value Ref Range   Hemoglobin A1C 8.8      Assessment & Plan:   1. Uncontrolled type 2 diabetes mellitus with complication, with long-term current use of insulin ([place])   - Patient has currently uncontrolled symptomatic type 2 DM since  73 years of age. -He continues to see improvement in his glycemic profile, has responded to recent addition of glipizide with slightly improving glycemic profile.  His previsit labs show A1c of 8.8% improving from 9.6%.     His diabetes is complicated by CVA, noncompliance, obesity/sedentary life, unaffordable medications and patient remains at a high risk for more acute and chronic complications of diabetes which include CAD, CVA, CKD, retinopathy, and neuropathy. These are all discussed in detail with the patient.  - I have counseled the patient on diet management and weight loss, by adopting a carbohydrate restricted/protein rich diet. -He admits to dietary indiscretion.  - he  admits there is a room for improvement in his diet and drink choices. -  Suggestion is made for him to avoid simple carbohydrates  from his diet including Cakes,  Sweet Desserts / Pastries, Ice Cream, Soda (diet and regular), Sweet Tea, Candies, Chips, Cookies, Sweet Pastries,  Store Bought Juices, Alcohol in Excess of  1-2 drinks a day, Artificial Sweeteners, Coffee Creamer, and "Sugar-free" Products. This will help patient to have stable blood glucose profile and potentially avoid unintended weight gain.   - I encouraged the patient to switch to  unprocessed or minimally processed complex starch and increased protein intake (animal or plant source), fruits, and vegetables.  - Patient is advised to stick to a routine mealtimes to eat 3 meals  a day and avoid unnecessary snacks ( to snack only to correct hypoglycemia).   - I have approached patient with the following individualized plan to manage diabetes and patient agrees:   - He is struggling to control diabetes, this is mainly due to his dietary indiscretion- follows random meal and insulin timing. -He is advised to continue monitoring blood glucose 4 times a day-before meals and at bedtime.   -He is advised to continue Lantus 80 units daily  at bedtime, NovoLog 25 units 3 times daily AC for for pre-meal blood glucose above 90 mg/dL. He is also given individualized correction dose from NovoLog for blood glucose readings above 150 mg/dL.  -He is advised to increase his glipizide to 10 mg XL p.o. daily at breakfast.   -He will greatly benefit from a CGM device.  I discussed and prescribed the freestyle libre device .  -Patient is encouraged to call clinic for blood glucose levels less than 70 or above 300 mg /dl.  -He is advised to continue low-dose metformin 500 mg by mouth twice a day after breakfast and supper.   2) BP/HTN: he is advised to home monitor blood pressure and report if > 140/90 on 2 separate readings. He is advised to continue lisinopril 10 mg p.o. daily, Lasix 40 mg p.o. daily as well.  He is tolerating clonidine 0.2 mg twice a day, and clonidine is helping.   3) Lipids/HPL: His lipid  panel showed controlled LDL at 58.   He is not on statins.   5) vitamin D deficiency: -He is on ongoing treatment with vitamin D3 5000 units daily, advised to continue on the same.    - I advised patient to maintain close follow up with Wyatt Haste, NP for primary care needs.  - Patient Care Time Today:  25 min, of which >50% was spent in  counseling and the rest reviewing his  current and  previous labs/studies, previous treatments, his blood glucose readings, and medications' doses and developing a plan for long-term care based on the latest recommendations for standards of care.   Jeff Garcia participated in the discussions, expressed understanding, and voiced agreement with the above plans.  All questions were answered to his satisfaction. he is encouraged to contact clinic should he have any questions or concerns prior to his return visit.    Follow up plan: - Return in about 4 months (around 03/05/2020) for Bring Meter and Logs- A1c in Office, Include 8 log sheets.  Glade Lloyd, MD Phone: [phone]  Fax: [phone]  -  This note was partially dictated with voice recognition software. Similar sounding words can be transcribed inadequately or may not  be corrected upon review.  11/05/2019, 5:23 PM

## 2019-11-17 ENCOUNTER — Other Ambulatory Visit: Payer: Self-pay | Admitting: "Endocrinology

## 2019-12-02 ENCOUNTER — Other Ambulatory Visit: Payer: Self-pay

## 2019-12-02 ENCOUNTER — Telehealth (HOSPITAL_COMMUNITY): Payer: Self-pay | Admitting: *Deleted

## 2019-12-02 DIAGNOSIS — R600 Localized edema: Secondary | ICD-10-CM

## 2019-12-02 NOTE — Telephone Encounter (Signed)

## 2019-12-03 ENCOUNTER — Other Ambulatory Visit: Payer: Self-pay

## 2019-12-03 ENCOUNTER — Ambulatory Visit (HOSPITAL_COMMUNITY)
Admission: RE | Admit: 2019-12-03 | Discharge: 2019-12-03 | Disposition: A | Discharge status: Home / Self Care | Payer: Medicare Other | Source: Ambulatory Visit | Attending: Vascular Surgery | Admitting: Vascular Surgery

## 2019-12-03 ENCOUNTER — Ambulatory Visit (INDEPENDENT_AMBULATORY_CARE_PROVIDER_SITE_OTHER): Payer: Medicare Other | Admitting: Vascular Surgery

## 2019-12-03 ENCOUNTER — Encounter: Payer: Self-pay | Admitting: Vascular Surgery

## 2019-12-03 VITALS — BP 145/78 | HR 97 | Temp 97.2°F | Resp 20 | Ht 67.0 in | Wt 313.0 lb

## 2019-12-03 DIAGNOSIS — I872 Venous insufficiency (chronic) (peripheral): Secondary | ICD-10-CM | POA: Diagnosis not present

## 2019-12-03 DIAGNOSIS — R600 Localized edema: Secondary | ICD-10-CM

## 2019-12-03 NOTE — Progress Notes (Signed)
REASON FOR CONSULT:    Bilateral lower extremity swelling.  The consult is requested by Dr. Rosealee Albee.  ASSESSMENT & PLAN:   CHRONIC VENOUS INSUFFICIENCY: Based on the patient's physical exam and venous duplex study he does have evidence of chronic venous insufficiency.  He has CEAP C4 venous disease.  We have discussed the importance of intermittent leg elevation.  It sounds like he tolerates this.  I explained that if he gets pain in his feet with leg elevation this may be related to his peripheral vascular disease.  He needs to pay close attention to this.  We did discuss the most effective position for leg elevation.  I encouraged him to continue to wear his knee-high compression stockings.  I offered to give him a prescription for new stockings however he says he has multiple pairs and does not need anymore.  I have encouraged him to avoid prolonged sitting and standing.  I encouraged him to walk as much as possible although his activity is limited by his back pain.  We discussed water aerobics which I think would be a good solution to get some exercise and help with his venous disease.  We also discussed the importance of weight management.  Based on his venous duplex he is not a candidate for laser ablation of the saphenous veins at this point.  I would like to see him back in 6 months.  PERIPHERAL VASCULAR DISEASE: Because of his size it is difficult to assess his pulses but based on his Doppler study he does have evidence of peripheral vascular disease bilaterally.  This is more significant on the right side.  We have discussed the importance of tobacco cessation.  I encouraged him to stay as active as possible.  When he returns in 6 months we will obtain ABIs to have as a baseline and I have ordered that.  Deitra Mayo, MD Office: [phone]   HPI:   Jeff Garcia is a pleasant 74 y.o. male, who was referred with bilateral lower extremity swelling.  I have reviewed the records  from the referring office.  The patient was seen on 09/18/2019 with sores on his legs.  He describes these as painful.  He had significant bilateral lower extremities edema and for this reason vascular surgery is consulted.  The patient presents with bilateral lower extremity edema which he states that he has had for years.  He is unaware of any previous history of DVT and has had no previous vein procedures.  He experiences pain in his legs described as aching and heaviness when he standing his symptoms are relieved somewhat with elevation.  He does not describe significant claudication although his activity is very limited by back pain and buttock pain.  He states that he had prostate surgery in the past and since then has had difficult time sitting and getting comfortable and has chronic back pain.  His risk factors for peripheral vascular disease include diabetes, hypertension, hypercholesterolemia, and tobacco use.  He smokes 1 pack/day of cigarettes and has been smoking since he was 74 years old.  He denies any family history of premature cardiovascular disease.  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  He states that he has not had open wounds on his legs.  Past Medical History:  Diagnosis Date  . Asthma   . BPH (benign prostatic hyperplasia)   . COPD (chronic obstructive pulmonary disease) ([place])   . Diabetes mellitus without  complication ([place])   . Hyperlipidemia   . Hypertension   . Neuropathy   . Stroke [facility])     Family History  Problem Relation Age of Onset  . Diabetes Father   . Diabetes Sister   . Diabetes Brother     SOCIAL HISTORY: Social History   Socioeconomic History  . Marital status: Divorced    Spouse name: Not on file  . Number of children: Not on file  . Years of education: Not on file  . Highest education level: Not on file  Occupational History  . Not on file  Tobacco Use  . Smoking status: Current Every  Day Smoker    Packs/day: 1.00    Years: 50.00    Pack years: 50.00    Types: Cigarettes  . Smokeless tobacco: Never Used  Substance and Sexual Activity  . Alcohol use: Yes    Alcohol/week: 1.0 standard drinks    Types: 1 Cans of beer per week    Comment: rare  . Drug use: Yes    Types: Cocaine  . Sexual activity: Not on file  Other Topics Concern  . Not on file  Social History Narrative  . Not on file   Social Determinants of Health   Financial Resource Strain:   . Difficulty of Paying Living Expenses: Not on file  Food Insecurity:   . Worried About Charity fundraiser in the Last Year: Not on file  . Ran Out of Food in the Last Year: Not on file  Transportation Needs:   . Lack of Transportation (Medical): Not on file  . Lack of Transportation (Non-Medical): Not on file  Physical Activity:   . Days of Exercise per Week: Not on file  . Minutes of Exercise per Session: Not on file  Stress:   . Feeling of Stress : Not on file  Social Connections:   . Frequency of Communication with Friends and Family: Not on file  . Frequency of Social Gatherings with Friends and Family: Not on file  . Attends Religious Services: Not on file  . Active Member of Clubs or Organizations: Not on file  . Attends Archivist Meetings: Not on file  . Marital Status: Not on file  Intimate Partner Violence:   . Fear of Current or Ex-Partner: Not on file  . Emotionally Abused: Not on file  . Physically Abused: Not on file  . Sexually Abused: Not on file    Allergies  Allergen Reactions  . Shellfish Allergy     Pt vomits    Current Outpatient Medications  Medication Sig Dispense Refill  . aspirin EC 81 MG tablet Take 81 mg by mouth daily.    . blood glucose meter kit and supplies Dispense based on patient and insurance preference. Use up to four times daily as directed. (FOR ICD-10 E11.65) 1 each 5  . Blood Glucose Monitoring Suppl (ACCU-CHEK GUIDE ME) w/Device KIT 1 Piece by Does  not apply route as directed. 1 kit 0  . cloNIDine (CATAPRES) 0.2 MG tablet Take 1 tablet by mouth twice daily 180 tablet 0  . Continuous Blood Gluc Sensor (FREESTYLE LIBRE 14 DAY SENSOR) MISC Inject 1 each into the skin every 14 (fourteen) days. Use as directed. 2 each 2  . furosemide (LASIX) 40 MG tablet Take 40 mg by mouth daily.    Marland Kitchen glipiZIDE (GLUCOTROL XL) 5 MG 24 hr tablet TAKE 2 TABLETS BY MOUTH ONCE DAILY WITH BREAKFAST 60 tablet 2  .  glucose blood (ACCU-CHEK GUIDE) test strip Use as instructed 4 x daily. e11.65 150 each 2  . glucose blood test strip 1 each by Other route 4 (four) times daily. Use as instructed 4 x daily. e11.65 150 each 5  . hydrOXYzine (ATARAX/VISTARIL) 50 MG tablet Take 50 mg by mouth at bedtime as needed for anxiety.    . insulin aspart (NOVOLOG FLEXPEN) 100 UNIT/ML FlexPen Inject 25-31 Units into the skin 3 (three) times daily with meals. Per sliding scale    . Insulin Glargine (LANTUS SOLOSTAR) 100 UNIT/ML Solostar Pen Inject 80 Units into the skin at bedtime.     . metFORMIN (GLUCOPHAGE) 500 MG tablet Take 1 tablet (500 mg total) by mouth 2 (two) times daily with a meal. 60 tablet 2  . Misc Natural Products (PROSTATE HEALTH PO) Take 2 capsules by mouth daily.    . Potassium Chloride ER 20 MEQ TBCR Take 20 mEq by mouth daily.      No current facility-administered medications for this visit.    REVIEW OF SYSTEMS:  '[X]'  denotes positive finding, '[ ]'  denotes negative finding Cardiac  Comments:  Chest pain or chest pressure:    Shortness of breath upon exertion: x   Short of breath when lying flat:    Irregular heart rhythm:        Vascular    Pain in calf, thigh, or hip brought on by ambulation:    Pain in feet at night that wakes you up from your sleep:     Blood clot in your veins:    Leg swelling:  x       Pulmonary    Oxygen at home:    Productive cough:     Wheezing:         Neurologic    Sudden weakness in arms or legs:     Sudden numbness in  arms or legs:     Sudden onset of difficulty speaking or slurred speech:    Temporary loss of vision in one eye:     Problems with dizziness:         Gastrointestinal    Blood in stool:     Vomited blood:         Genitourinary    Burning when urinating:     Blood in urine:        Psychiatric    Major depression:         Hematologic    Bleeding problems:    Problems with blood clotting too easily:        Skin    Rashes or ulcers:        Constitutional    Fever or chills:     PHYSICAL EXAM:   Vitals:   12/03/19 1009  BP: (!) 145/78  Pulse: 97  Resp: 20  Temp: (!) 97.2 F (36.2 C)  TempSrc: Temporal  SpO2: 93%  Weight: (!) 313 lb (142 kg)  Height: '5\' 7"'  (1.702 m)   Body mass index is 49.02 kg/m.  GENERAL: The patient is a well-nourished male, in no acute distress. The vital signs are documented above. CARDIAC: There is a regular rate and rhythm.  VASCULAR: I do not detect carotid bruits. Because of his size I cannot palpate femoral pulses or popliteal pulses. I cannot palpate pedal pulses. On the right side he has a monophasic anterior tibial signal with the Doppler.  I cannot obtain a dorsalis pedis signal.  He has a monophasic posterior tibial  signal. On the left side he has a monophasic dorsalis pedis signal and a biphasic posterior tibial signal. RIGHT CALF EQUALS 18 3/4 INCHES LEFT CALF EQUALS 18-1/2 INCHES PULMONARY: There is good air exchange bilaterally without wheezing or rales. ABDOMEN: Soft and non-tender with normal pitched bowel sounds.  It is difficult to assess for an aneurysm because of his size. MUSCULOSKELETAL: There are no major deformities or cyanosis. NEUROLOGIC: No focal weakness or paresthesias are detected. SKIN: There are no ulcers or rashes noted. PSYCHIATRIC: The patient has a normal affect.  DATA:    VENOUS DUPLEX: I have independently interpreted his venous duplex scan today.  On the right side there is no evidence of DVT.   There is deep venous reflux involving the common femoral vein.  There is no reflux in the saphenofemoral junction but there is reflux in the great saphenous vein from the mid thigh to the proximal calf.  The vein is not especially dilated.  On the left side there is no evidence of DVT.  There is reflux in the common femoral vein and femoral vein.  There is reflux at the saphenofemoral junction but the great saphenous vein in the thigh does not have reflux.  There is a segment of reflux in the great saphenous vein in the proximal calf.  LABS: His creatinine on 10/28/2019 was 1.7.  A total of 60 minutes was spent on this visit. 30 minutes was face to face time. More than 50% of the time was spent on counseling and coordinating with the patient.

## 2019-12-04 ENCOUNTER — Encounter: Payer: Self-pay | Admitting: Vascular Surgery

## 2019-12-04 ENCOUNTER — Encounter (HOSPITAL_COMMUNITY): Payer: Medicare HMO

## 2019-12-08 ENCOUNTER — Other Ambulatory Visit: Payer: Self-pay | Admitting: *Deleted

## 2019-12-08 DIAGNOSIS — I872 Venous insufficiency (chronic) (peripheral): Secondary | ICD-10-CM

## 2019-12-08 DIAGNOSIS — R600 Localized edema: Secondary | ICD-10-CM

## 2019-12-23 ENCOUNTER — Other Ambulatory Visit (HOSPITAL_COMMUNITY): Payer: Self-pay | Admitting: Nephrology

## 2019-12-23 ENCOUNTER — Other Ambulatory Visit: Payer: Self-pay | Admitting: Nephrology

## 2019-12-23 DIAGNOSIS — N189 Chronic kidney disease, unspecified: Secondary | ICD-10-CM

## 2019-12-29 ENCOUNTER — Other Ambulatory Visit: Payer: Self-pay | Admitting: Physician Assistant

## 2019-12-29 DIAGNOSIS — G8929 Other chronic pain: Secondary | ICD-10-CM

## 2019-12-31 ENCOUNTER — Ambulatory Visit (HOSPITAL_COMMUNITY): Payer: Medicare Other

## 2020-01-07 ENCOUNTER — Ambulatory Visit (HOSPITAL_COMMUNITY)
Admission: RE | Admit: 2020-01-07 | Discharge: 2020-01-07 | Disposition: A | Discharge status: Home / Self Care | Payer: Medicare Other | Source: Ambulatory Visit | Attending: Nephrology | Admitting: Nephrology

## 2020-01-07 ENCOUNTER — Other Ambulatory Visit (HOSPITAL_COMMUNITY): Payer: Self-pay | Admitting: Nephrology

## 2020-01-07 ENCOUNTER — Other Ambulatory Visit: Payer: Self-pay

## 2020-01-07 DIAGNOSIS — R600 Localized edema: Secondary | ICD-10-CM | POA: Diagnosis present

## 2020-01-07 DIAGNOSIS — N189 Chronic kidney disease, unspecified: Secondary | ICD-10-CM | POA: Insufficient documentation

## 2020-01-07 DIAGNOSIS — I10 Essential (primary) hypertension: Secondary | ICD-10-CM | POA: Diagnosis not present

## 2020-01-07 DIAGNOSIS — E785 Hyperlipidemia, unspecified: Secondary | ICD-10-CM | POA: Insufficient documentation

## 2020-01-07 DIAGNOSIS — E1122 Type 2 diabetes mellitus with diabetic chronic kidney disease: Secondary | ICD-10-CM | POA: Insufficient documentation

## 2020-01-07 NOTE — Progress Notes (Signed)
*  PRELIMINARY RESULTS* Echocardiogram 2D Echocardiogram has been performed.  Stacey Drain 01/07/2020, 2:12 PM

## 2020-02-01 ENCOUNTER — Other Ambulatory Visit: Payer: Medicare Other

## 2020-02-12 ENCOUNTER — Ambulatory Visit: Payer: Medicare HMO | Admitting: Urology

## 2020-03-07 ENCOUNTER — Ambulatory Visit: Payer: Medicare HMO | Admitting: "Endocrinology

## 2020-03-18 ENCOUNTER — Ambulatory Visit: Payer: Medicare Other | Admitting: Urology

## 2020-03-18 NOTE — Progress Notes (Deleted)
Subjective:  1. BPH with urinary obstruction   2. Urgency of urination   3. Nocturia     Jeff Garcia is a 74 year-old male established patient who is here for symptoms of enlarged prostate.  He has been treated with Flomax. The patient has had a TURP.   He continues to have nocturia 4+x and nocturnal UUI and says it is worse than ever. He has tried Oxybutynin and didn't really have much benefit. He didn't benefit from [place] or Myrbetriq. He was unable to the the Trospium because of insurance issues. He never got the sleep study done and he didn't get set up for PTNS. He has had his diuretic doubled and takes one prior to bed. He is on antibiotics for cellulitis in his legs.    Urodynamics were done earlier this year for persistent OAB post TURP done on 09/09/18. He was found to have a 700+ ml bladder with delayed sensation and low amplitude instability. He emptied well prior to the study but his post study PVR was 360m that required CIC prior to discharge.        ROS:  ROS:  A complete review of systems was performed.  All systems are negative except for pertinent findings as noted.   ROS  Allergies  Allergen Reactions  . Shellfish Allergy     Pt vomits    Outpatient Encounter Medications as of 03/18/2020  Medication Sig Note  . aspirin EC 81 MG tablet Take 81 mg by mouth daily.   . blood glucose meter kit and supplies Dispense based on patient and insurance preference. Use up to four times daily as directed. (FOR ICD-10 E11.65)   . Blood Glucose Monitoring Suppl (ACCU-CHEK GUIDE ME) w/Device KIT 1 Piece by Does not apply route as directed.   . cloNIDine (CATAPRES) 0.2 MG tablet Take 1 tablet by mouth twice daily   . Continuous Blood Gluc Sensor (FREESTYLE LIBRE 14 DAY SENSOR) MISC Inject 1 each into the skin every 14 (fourteen) days. Use as directed.   . furosemide (LASIX) 40 MG tablet Take 40 mg by mouth daily.   .Marland KitchenglipiZIDE (GLUCOTROL XL) 5 MG 24 hr tablet TAKE 2  TABLETS BY MOUTH ONCE DAILY WITH BREAKFAST   . glucose blood (ACCU-CHEK GUIDE) test strip Use as instructed 4 x daily. e11.65   . glucose blood test strip 1 each by Other route 4 (four) times daily. Use as instructed 4 x daily. e11.65   . hydrOXYzine (ATARAX/VISTARIL) 50 MG tablet Take 50 mg by mouth at bedtime as needed for anxiety.   . insulin aspart (NOVOLOG FLEXPEN) 100 UNIT/ML FlexPen Inject 25-31 Units into the skin 3 (three) times daily with meals. Per sliding scale 09/09/2018: 29 units  . Insulin Glargine (LANTUS SOLOSTAR) 100 UNIT/ML Solostar Pen Inject 80 Units into the skin at bedtime.  09/09/2018: Entire 80 units  . metFORMIN (GLUCOPHAGE) 500 MG tablet Take 1 tablet (500 mg total) by mouth 2 (two) times daily with a meal.   . Misc Natural Products (PROSTATE HEALTH PO) Take 2 capsules by mouth daily.   . Potassium Chloride ER 20 MEQ TBCR Take 20 mEq by mouth daily.     No facility-administered encounter medications on file as of 03/18/2020.    Past Medical History:  Diagnosis Date  . Asthma   . BPH (benign prostatic hyperplasia)   . [place]   . [place]   . Hyperlipidemia   .  Hypertension   . Neuropathy   . Stroke [facility])     Past Surgical History:  Procedure Laterality Date  . BUNIONECTOMY  1993  . TRANSURETHRAL RESECTION OF PROSTATE N/A 09/09/2018   Procedure: TRANSURETHRAL RESECTION OF THE PROSTATE (TURP);  Surgeon: Irine Seal, MD;  Location: [facility] OR[facility];  Service: Urology;  Laterality: N/A;    Social History   Socioeconomic History  . Marital status: Divorced    Spouse name: Not on file  . Number of children: Not on file  . Years of education: Not on file  . Highest education level: Not on file  Occupational History  . Not on file  Tobacco Use  . Smoking status: Current Every Day Smoker    Packs/day: 1.00    Years: 50.00    Pack years: 50.00    Types: Cigarettes  . Smokeless tobacco:  Never Used  Substance and Sexual Activity  . Alcohol use: Yes    Alcohol/week: 1.0 standard drinks    Types: 1 Cans of beer per week    Comment: rare  . Drug use: Yes    Types: Cocaine  . Sexual activity: Not on file  Other Topics Concern  . Not on file  Social History Narrative  . Not on file   Social Determinants of Health   Financial Resource Strain:   . Difficulty of Paying Living Expenses:   Food Insecurity:   . Worried About Charity fundraiser in the Last Year:   . Arboriculturist in the Last Year:   Transportation Needs:   . Film/video editor (Medical):   Marland Kitchen Lack of Transportation (Non-Medical):   Physical Activity:   . Days of Exercise per Week:   . Minutes of Exercise per Session:   Stress:   . Feeling of Stress :   Social Connections:   . Frequency of Communication with Friends and Family:   . Frequency of Social Gatherings with Friends and Family:   . Attends Religious Services:   . Active Member of Clubs or Organizations:   . Attends Archivist Meetings:   Marland Kitchen Marital Status:   Intimate Partner Violence:   . Fear of Current or Ex-Partner:   . Emotionally Abused:   Marland Kitchen Physically Abused:   . Sexually Abused:     Family History  Problem Relation Age of Onset  . Diabetes Father   . Diabetes Sister   . Diabetes Brother        Objective: There were no vitals filed for this visit.   Physical Exam  Lab Results:  No results found for this or any previous visit (from the past 24 hour(s)).  BMET No results for input(s): NA, K, CL, CO2, GLUCOSE, BUN, CREATININE, CALCIUM in the last 72 hours. PSA No results found for: PSA No results found for: TESTOSTERONE    Studies/Results: No results found.    Assessment & Plan: No problem-specific Assessment & Plan notes found for this encounter.    No orders of the defined types were placed in this encounter.    No orders of the defined types were placed in this encounter.     No  follow-ups on file.   CC: Wyatt Haste, NP      Irine Seal 03/18/2020

## 2020-04-26 DEATH — deceased

## 2020-04-29 ENCOUNTER — Ambulatory Visit: Payer: Medicare Other | Admitting: Urology

## 2020-07-18 IMAGING — DX DG LUMBAR SPINE COMPLETE 4+V
5 series · 5 of 5 positions shown · non-contrast
Comparison: None.

CLINICAL DATA: Lumbago with sciatica right and left. Lumbosacral
back pain with bilateral sciatic pain for many years.

EXAM:
LUMBAR SPINE - COMPLETE 4+ VIEW

[l-spine ap]
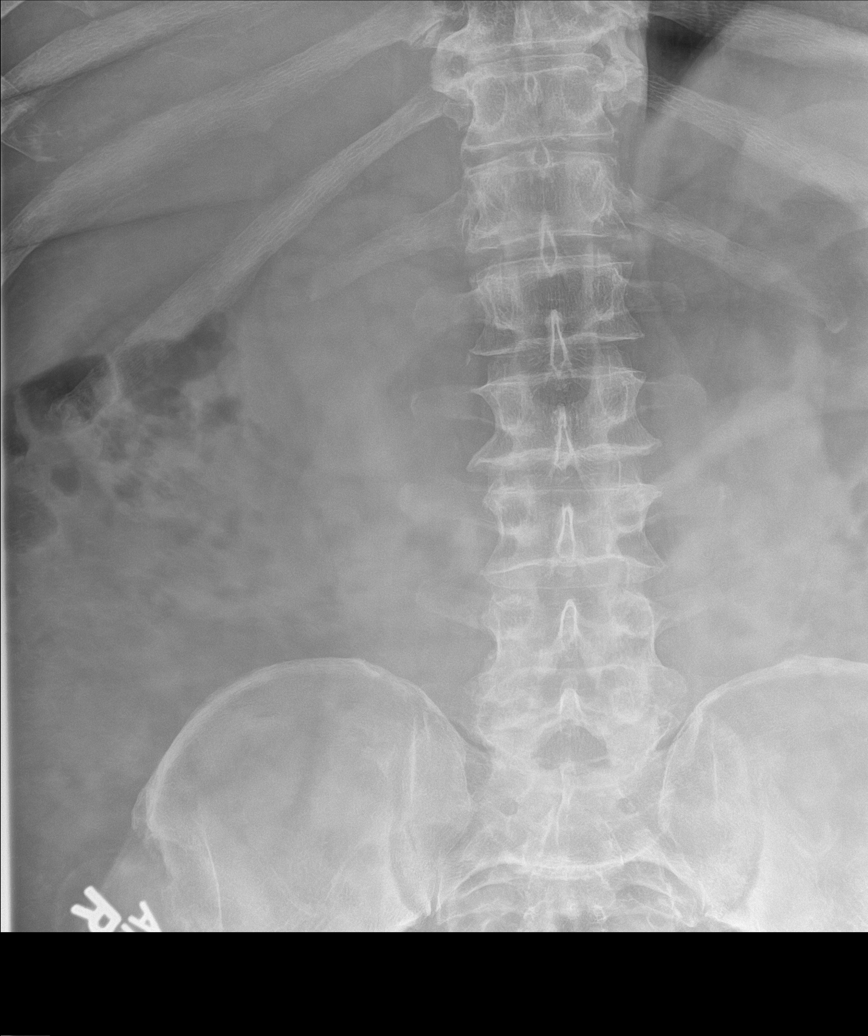

[l-spine obl (1 of 2)]
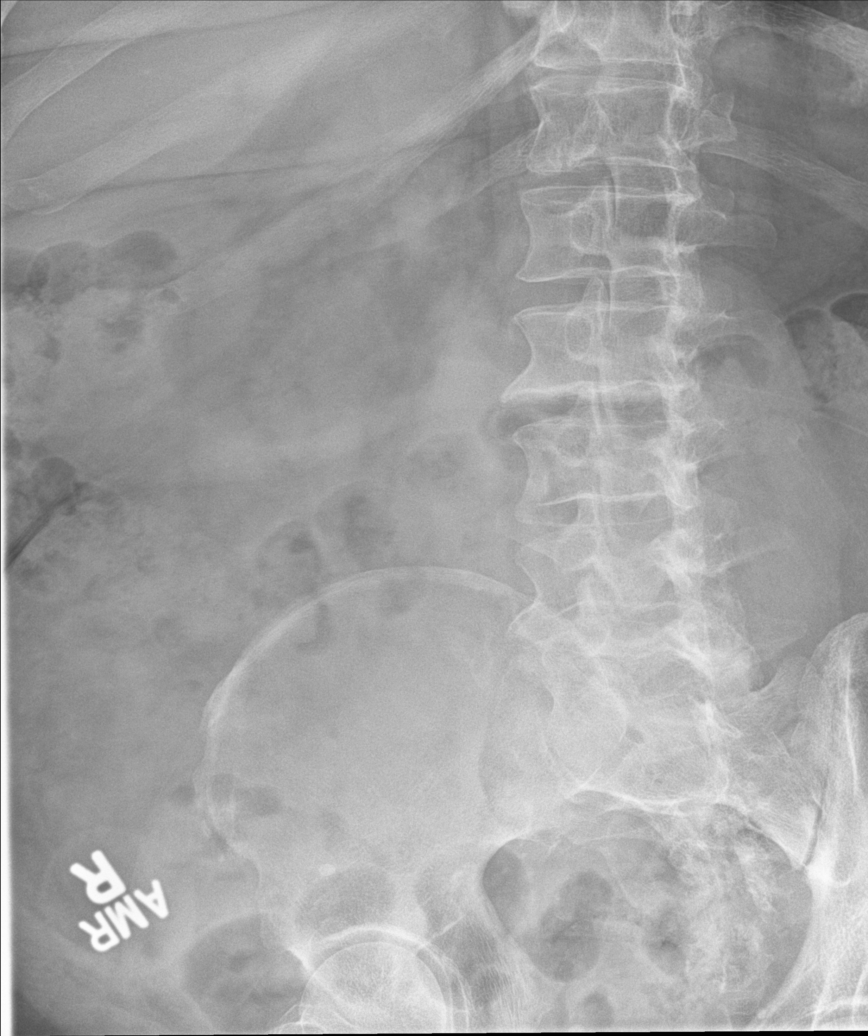

[l-spine obl (2 of 2)]
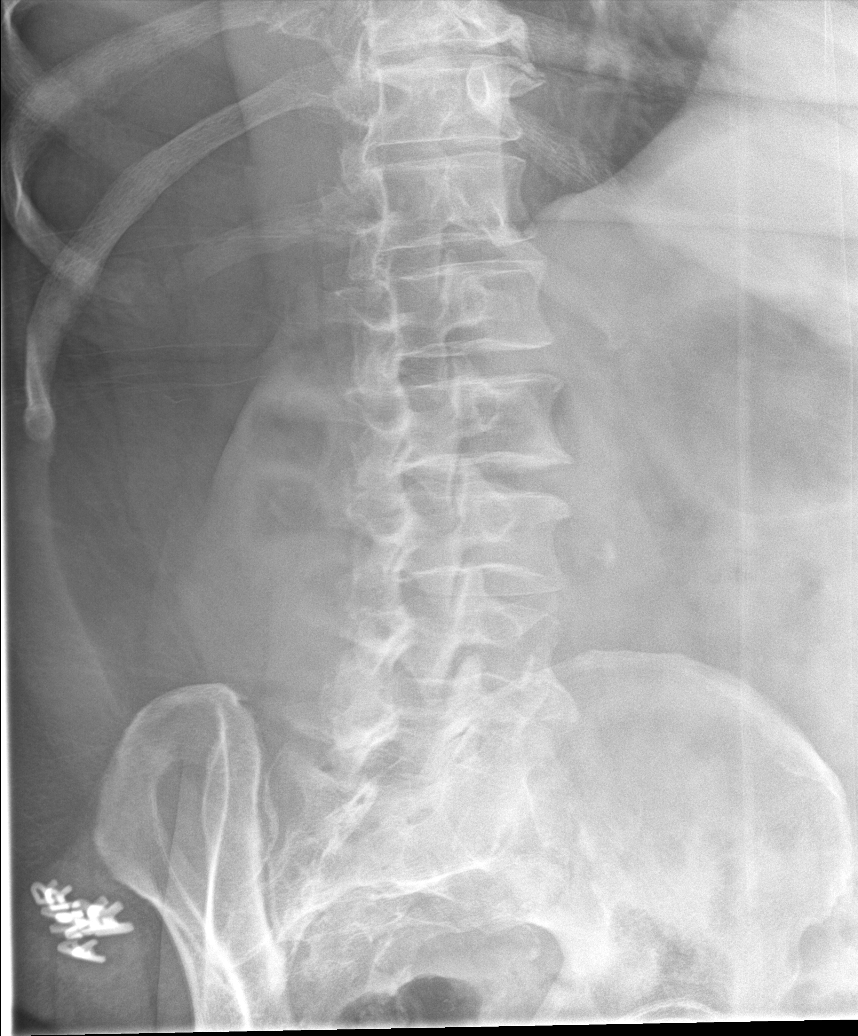

[l-spine lat]
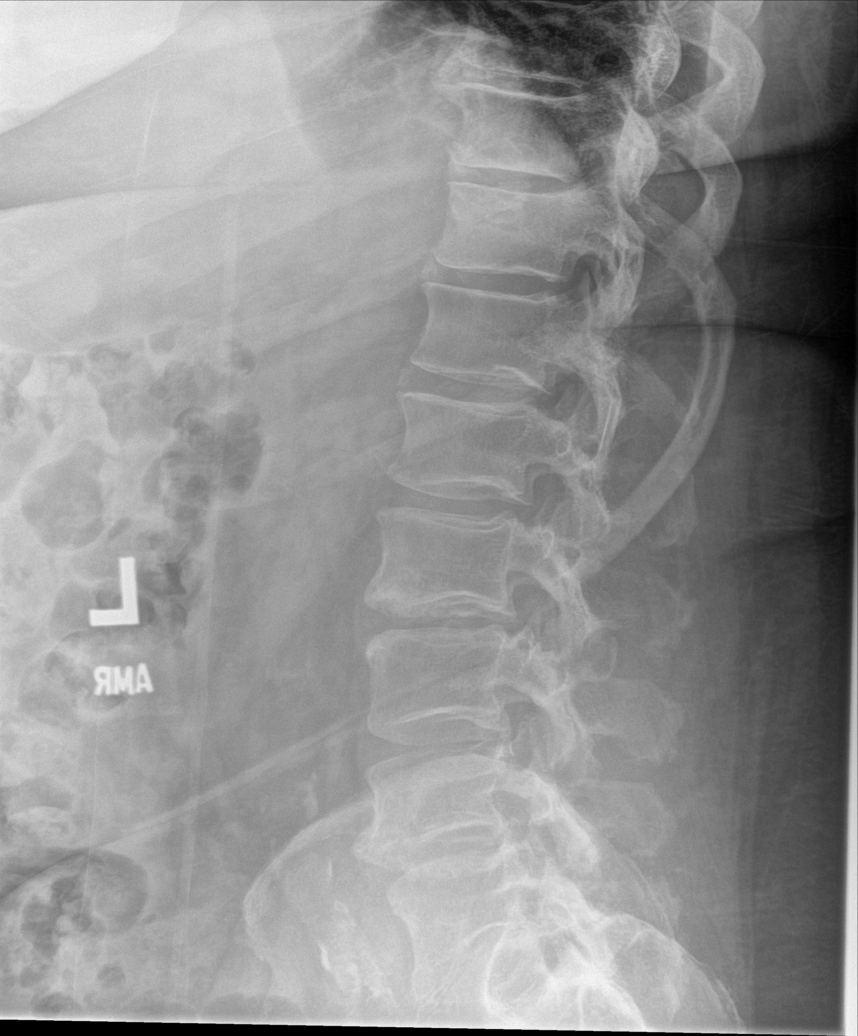

[l-spine spot]
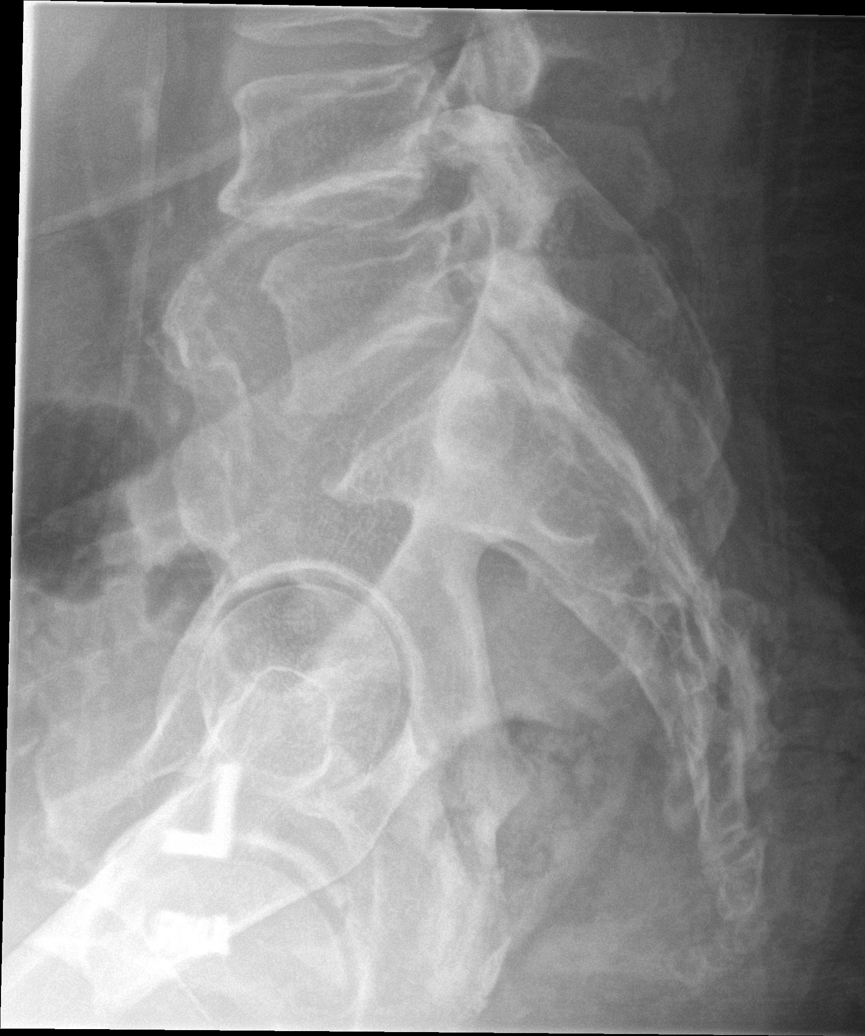

[5 of 5 positions shown; findings below may reference images not displayed]

FINDINGS: Five non-rib-bearing lumbar vertebra. The alignment is maintained.
Vertebral body heights are normal. There is no listhesis. The
posterior elements are intact. Mild multilevel endplate spurring.
Slight disc space narrowing at L2-L3 and L4-L5. Facet hypertrophy in
the lower lumbar spine. No fracture. Sclerosis about both sacroiliac
joints, left greater than right.
IMPRESSION: Multilevel spondylosis with degenerative disc disease at L2-L3 and
L4-L5 as well as facet hypertrophy in the lower lumbar spine.

## 2020-07-18 IMAGING — DX DG PELVIS 1-2V
1 series · 1 of 1 positions shown · non-contrast
Comparison: None.

CLINICAL DATA: Lumbago with sciatica right and left. Lumbosacral
back pain with bilateral sciatic pain for many years.

EXAM:
PELVIS - 1-2 VIEW

[pelvis ap]
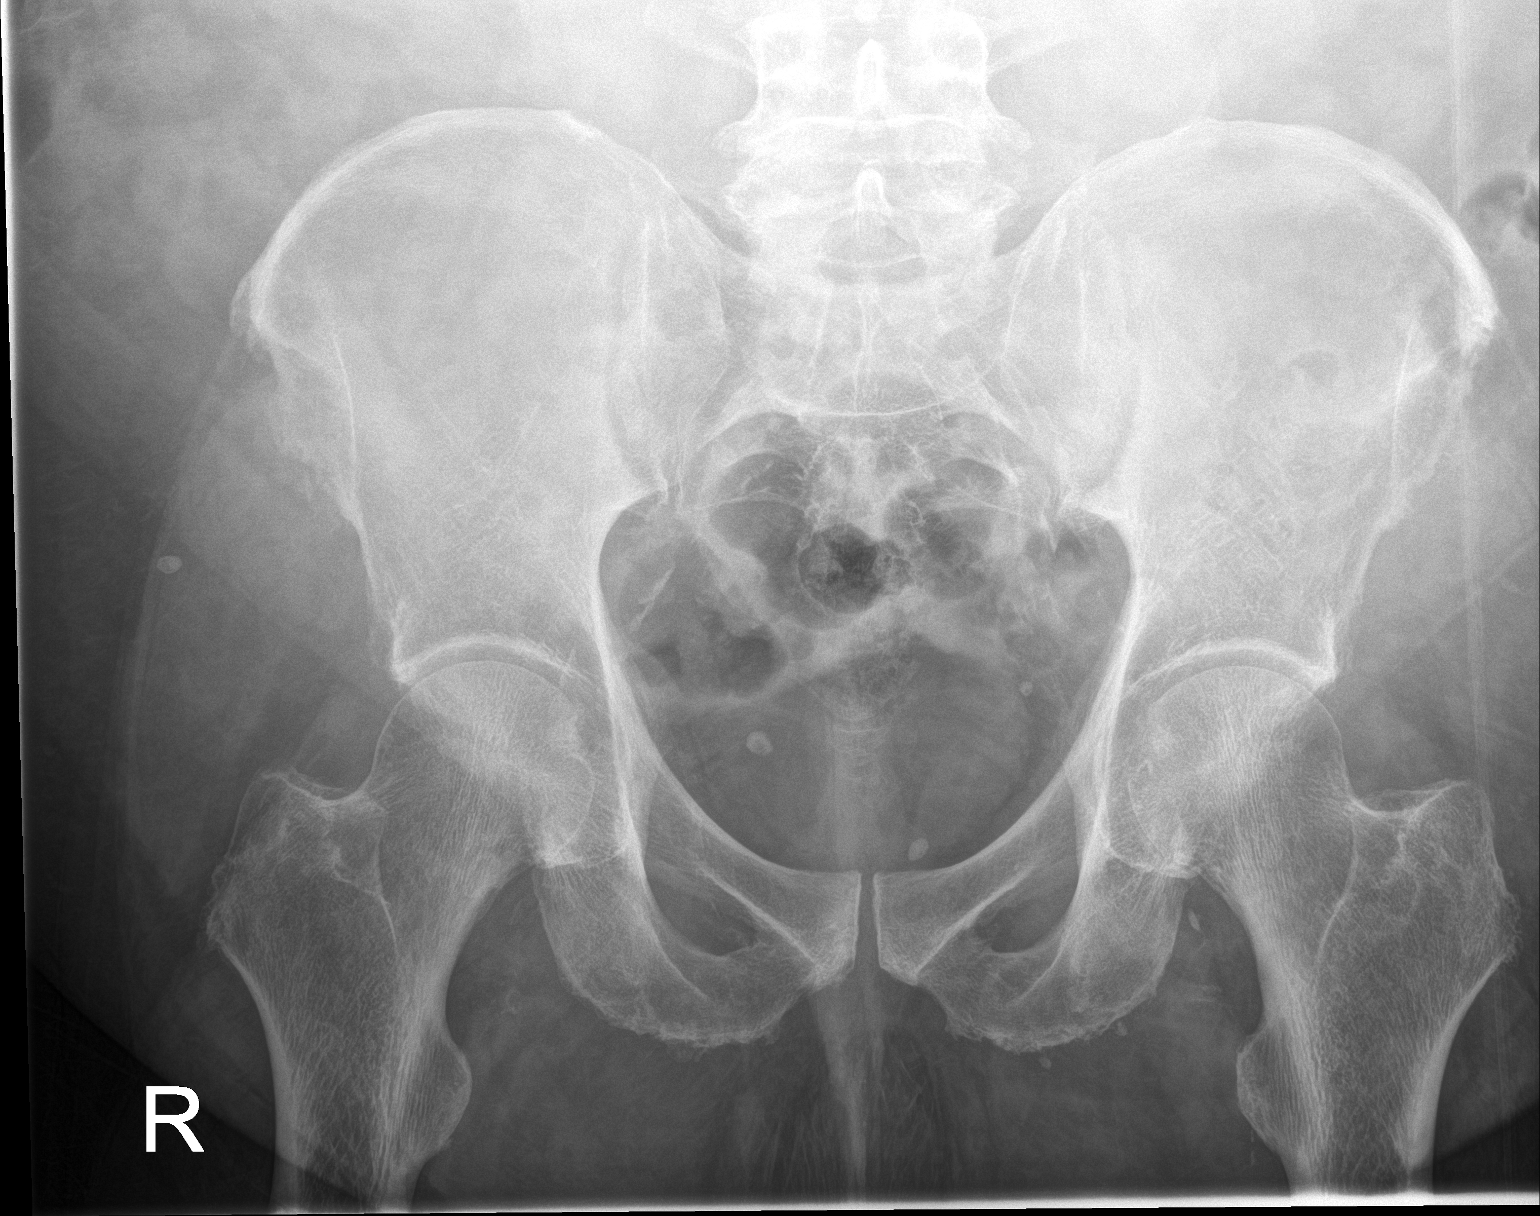

[1 of 1 positions shown; findings below may reference images not displayed]

FINDINGS: The cortical margins of the bony pelvis are intact. No fracture.
Pubic symphysis and sacroiliac joints are congruent. Mild sclerosis
about both sacroiliac joints, left greater than right. Both femoral
heads are well-seated in the respective acetabula with mild
bilateral acetabular spurring.
IMPRESSION: 1. Mild sclerosis about both sacroiliac joints, left greater than
right.
2. Mild bilateral hip osteoarthritis.

## 2020-10-16 IMAGING — US US RENAL
1 series · 14 of 25 positions shown · non-contrast
Comparison: None.

CLINICAL DATA: Chronic renal disease

EXAM:
RENAL / URINARY TRACT ULTRASOUND COMPLETE

[Series 1: us renal · 0.27mm/px · 14 of 44 slices shown]
[im 1/44]
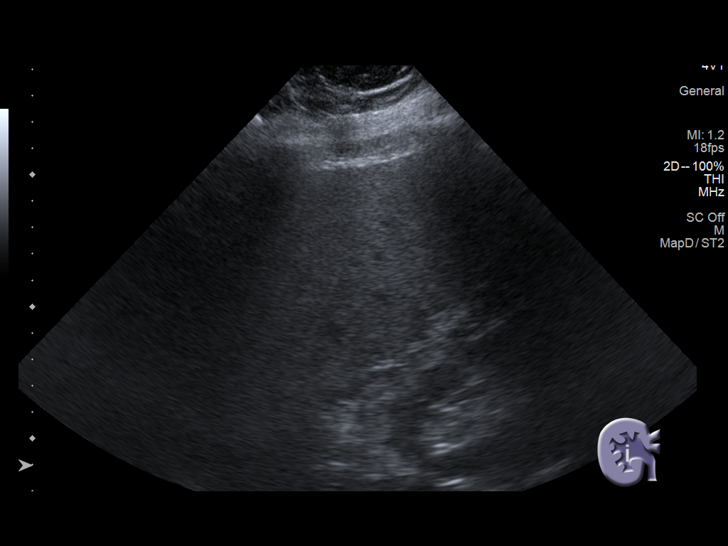
[im 4/44]
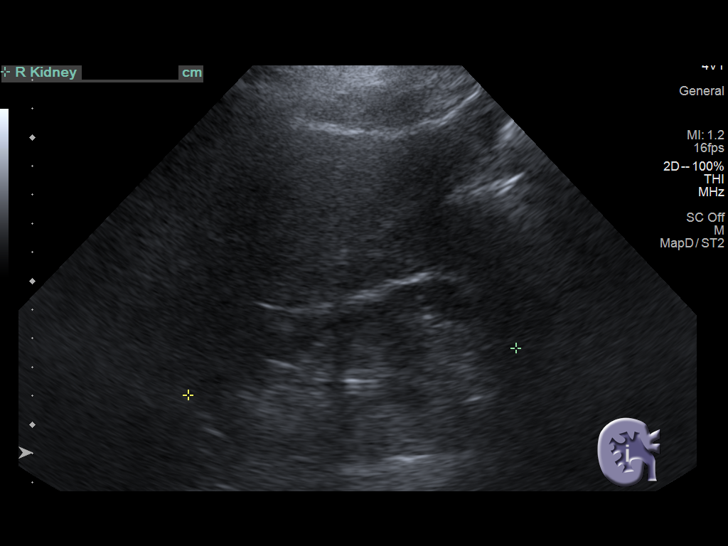
[im 8/44]
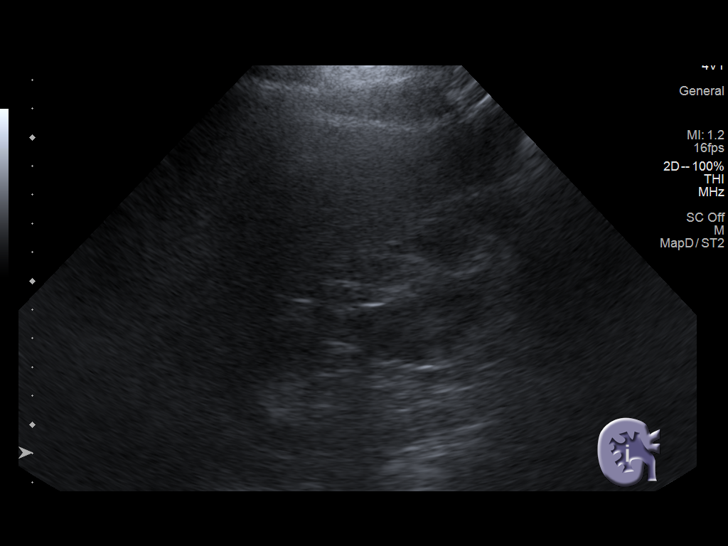
[im 11/44]
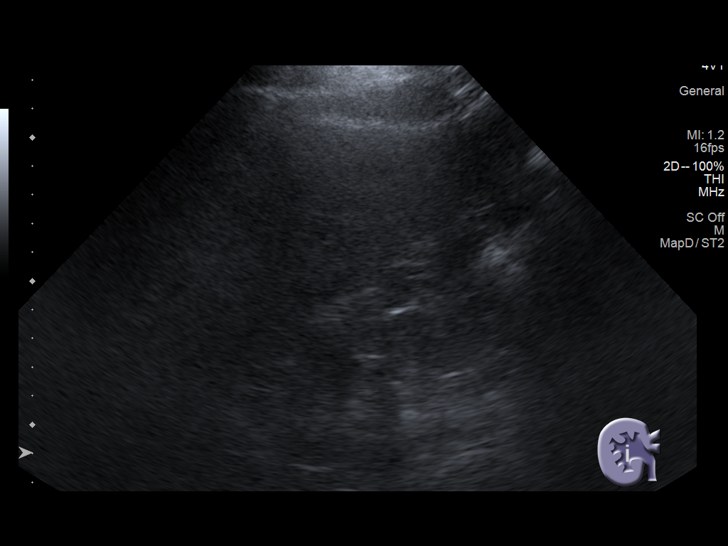
[im 15/44]
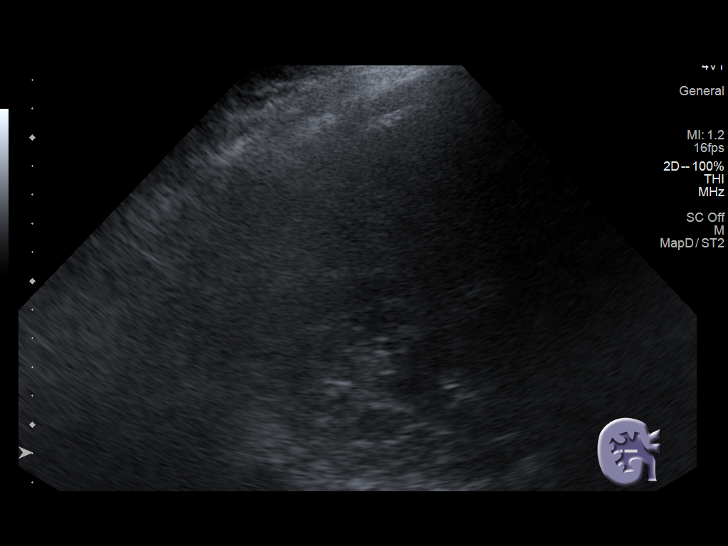
[im 17/44]
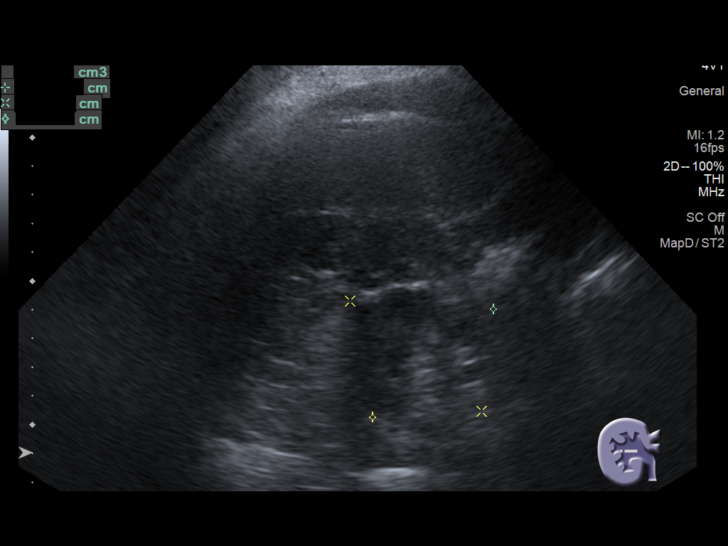
[im 20/44]
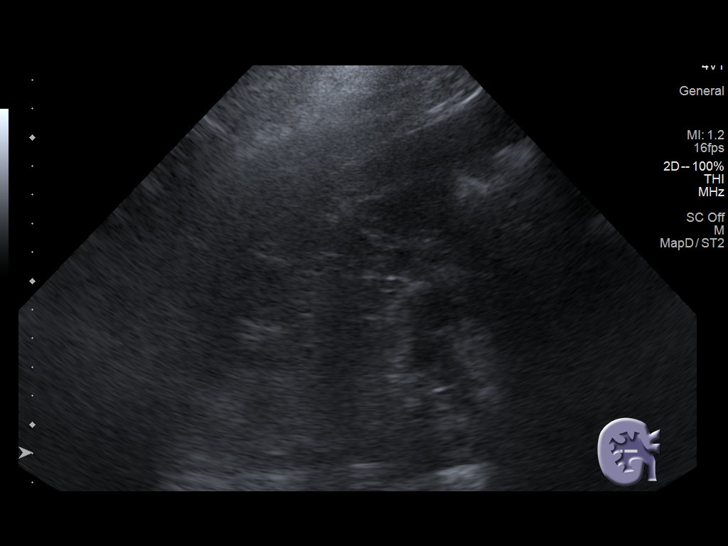
[im 24/44]
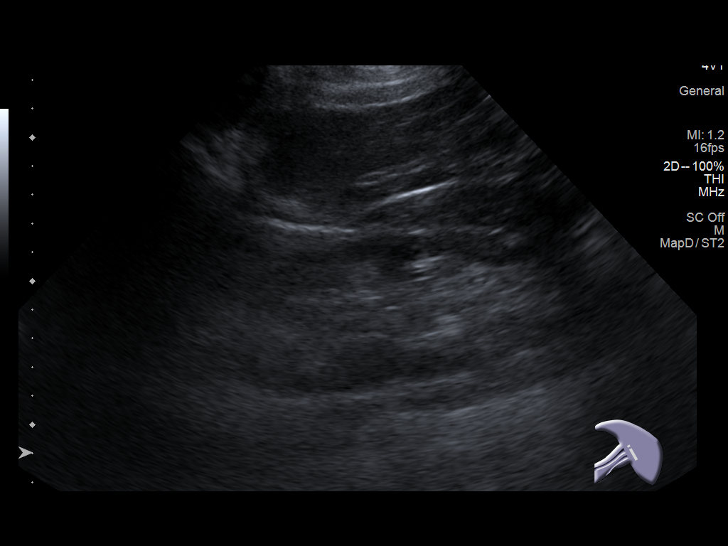
[im 27/44]
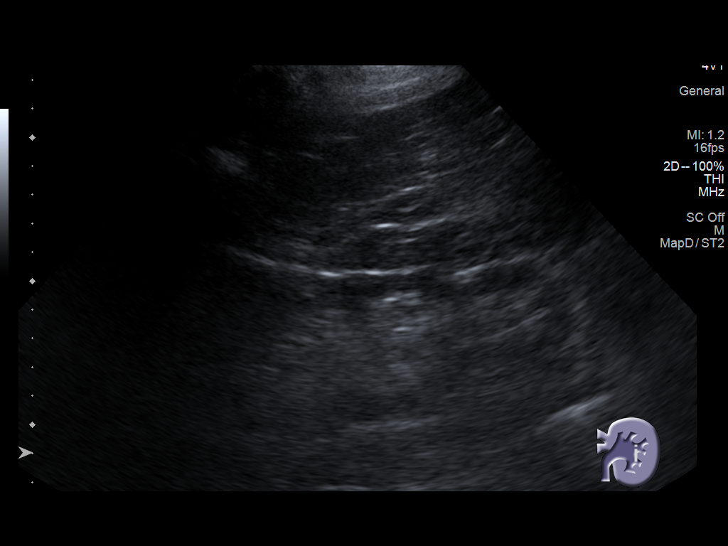
[im 29/44]
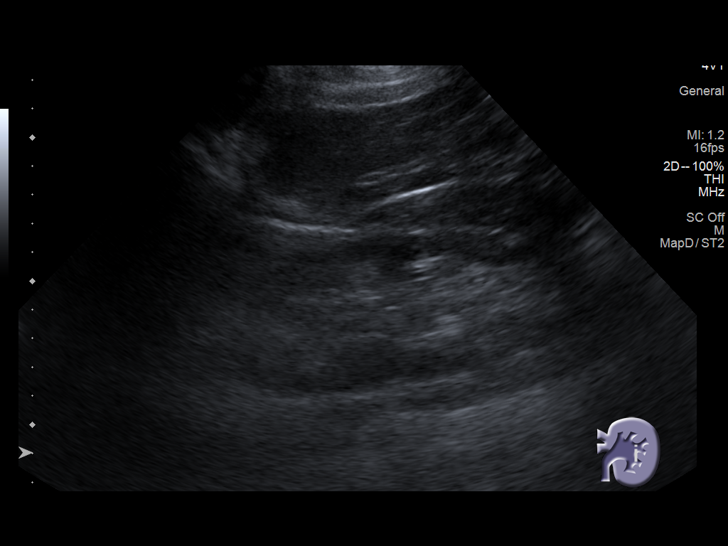
[im 33/44]
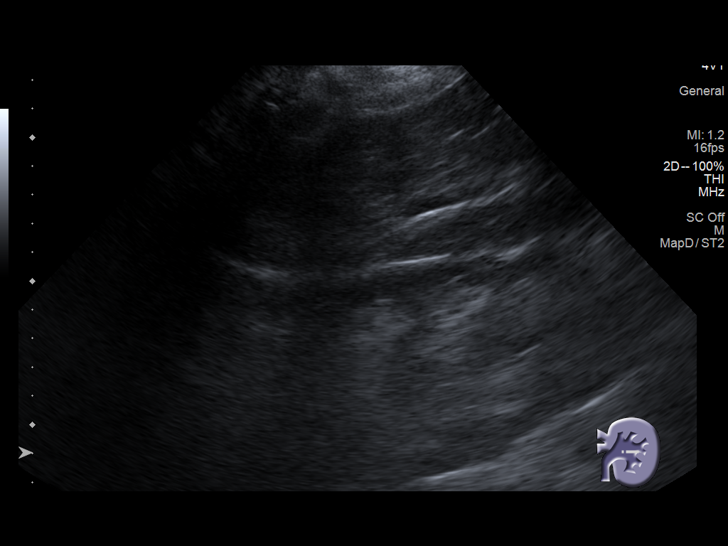
[im 36/44]
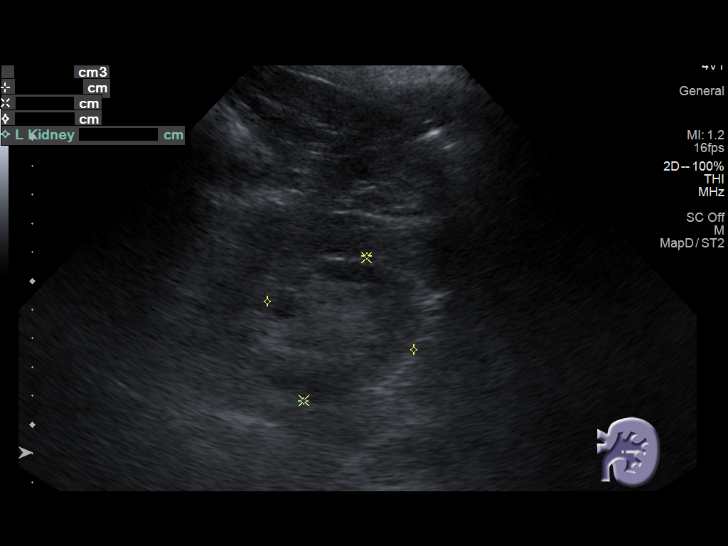
[im 40/44]
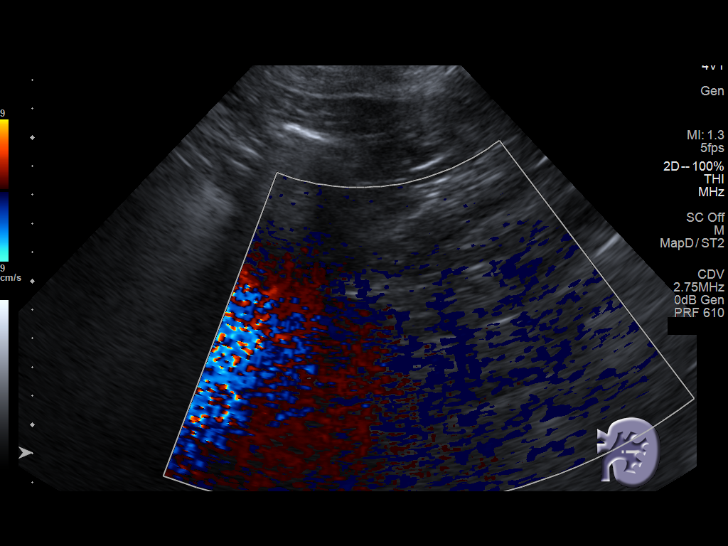
[im 44/44]
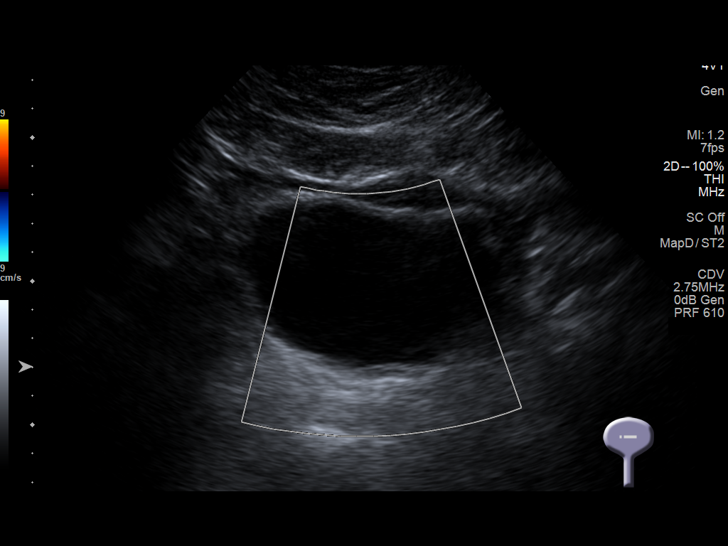

[14 of 25 positions shown; findings below may reference images not displayed]

FINDINGS: Right Kidney:

Renal measurements: 11.2 x 5.9 x 5.6 cm = volume: 197 mL .
Echogenicity within normal limits. No mass or hydronephrosis
visualized. Mild cortical thinning is noted.

Left Kidney:

Renal measurements: 12.5 x 5.4 x 5.3 cm. = volume: 192 mL.
Echogenicity within normal limits. No mass or hydronephrosis
visualized. Mild cortical thinning is noted.

Bladder:

Appears normal for degree of bladder distention.

Other:

None.
IMPRESSION: Mild cortical thinning bilaterally. No other focal abnormality is
seen.
